# Patient Record
Sex: Female | Born: 1950 | Race: Black or African American | Hispanic: No | Smoking: Never smoker
Health system: Southern US, Community
[De-identification: ages and names within clinical notes are randomized; demographics above are authoritative.]

## PROBLEM LIST (undated history)

## (undated) DIAGNOSIS — D219 Benign neoplasm of connective and other soft tissue, unspecified: Secondary | ICD-10-CM

## (undated) DIAGNOSIS — G43909 Migraine, unspecified, not intractable, without status migrainosus: Secondary | ICD-10-CM

## (undated) DIAGNOSIS — D649 Anemia, unspecified: Secondary | ICD-10-CM

## (undated) DIAGNOSIS — Z5189 Encounter for other specified aftercare: Secondary | ICD-10-CM

## (undated) DIAGNOSIS — H269 Unspecified cataract: Secondary | ICD-10-CM

## (undated) DIAGNOSIS — I10 Essential (primary) hypertension: Secondary | ICD-10-CM

---
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
## 1984-05-02 DIAGNOSIS — G43909 Migraine, unspecified, not intractable, without status migrainosus: Secondary | ICD-10-CM

## 1984-05-02 HISTORY — DX: Migraine, unspecified, not intractable, without status migrainosus: G43.909

## 1985-05-02 DIAGNOSIS — D219 Benign neoplasm of connective and other soft tissue, unspecified: Secondary | ICD-10-CM

## 1985-05-02 HISTORY — DX: Benign neoplasm of connective and other soft tissue, unspecified: D21.9

## 1998-03-24 ENCOUNTER — Observation Stay (HOSPITAL_COMMUNITY): Admission: AD | Admit: 1998-03-24 | Discharge: 1998-03-25 | Payer: Self-pay | Admitting: Family Medicine

## 1999-04-01 ENCOUNTER — Emergency Department (HOSPITAL_COMMUNITY): Admission: EM | Admit: 1999-04-01 | Discharge: 1999-04-01 | Payer: Self-pay | Admitting: Emergency Medicine

## 2000-05-20 ENCOUNTER — Emergency Department (HOSPITAL_COMMUNITY): Admission: EM | Admit: 2000-05-20 | Discharge: 2000-05-20 | Payer: Self-pay | Admitting: Emergency Medicine

## 2000-05-30 ENCOUNTER — Ambulatory Visit (HOSPITAL_COMMUNITY): Admission: RE | Admit: 2000-05-30 | Discharge: 2000-05-30 | Payer: Self-pay

## 2000-05-30 ENCOUNTER — Encounter: Payer: Self-pay | Admitting: Family Medicine

## 2000-06-15 ENCOUNTER — Encounter: Payer: Self-pay | Admitting: Family Medicine

## 2000-06-15 ENCOUNTER — Ambulatory Visit (HOSPITAL_COMMUNITY): Admission: RE | Admit: 2000-06-15 | Discharge: 2000-06-15 | Payer: Self-pay | Admitting: Family Medicine

## 2000-06-26 ENCOUNTER — Emergency Department (HOSPITAL_COMMUNITY): Admission: EM | Admit: 2000-06-26 | Discharge: 2000-06-27 | Payer: Self-pay | Admitting: Emergency Medicine

## 2000-06-27 ENCOUNTER — Encounter: Payer: Self-pay | Admitting: Emergency Medicine

## 2000-08-04 ENCOUNTER — Other Ambulatory Visit: Admission: RE | Admit: 2000-08-04 | Discharge: 2000-08-04 | Payer: Self-pay | Admitting: *Deleted

## 2000-08-04 ENCOUNTER — Encounter: Admission: RE | Admit: 2000-08-04 | Discharge: 2000-08-04 | Payer: Self-pay | Admitting: Obstetrics & Gynecology

## 2001-03-02 ENCOUNTER — Encounter: Admission: RE | Admit: 2001-03-02 | Discharge: 2001-03-02 | Payer: Self-pay | Admitting: Obstetrics & Gynecology

## 2001-12-28 ENCOUNTER — Emergency Department (HOSPITAL_COMMUNITY): Admission: EM | Admit: 2001-12-28 | Discharge: 2001-12-28 | Payer: Self-pay | Admitting: Emergency Medicine

## 2002-01-02 ENCOUNTER — Encounter: Admission: RE | Admit: 2002-01-02 | Discharge: 2002-01-02 | Payer: Self-pay | Admitting: Internal Medicine

## 2002-01-17 ENCOUNTER — Encounter: Admission: RE | Admit: 2002-01-17 | Discharge: 2002-01-17 | Payer: Self-pay | Admitting: Obstetrics and Gynecology

## 2002-01-20 ENCOUNTER — Emergency Department (HOSPITAL_COMMUNITY): Admission: EM | Admit: 2002-01-20 | Discharge: 2002-01-20 | Payer: Self-pay | Admitting: Emergency Medicine

## 2002-01-24 ENCOUNTER — Encounter: Payer: Self-pay | Admitting: Neurology

## 2002-01-24 ENCOUNTER — Ambulatory Visit (HOSPITAL_COMMUNITY): Admission: RE | Admit: 2002-01-24 | Discharge: 2002-01-24 | Payer: Self-pay | Admitting: Neurology

## 2002-02-07 ENCOUNTER — Encounter: Admission: RE | Admit: 2002-02-07 | Discharge: 2002-02-07 | Payer: Self-pay | Admitting: Obstetrics and Gynecology

## 2002-02-22 ENCOUNTER — Encounter: Admission: RE | Admit: 2002-02-22 | Discharge: 2002-02-22 | Payer: Self-pay | Admitting: *Deleted

## 2002-02-22 ENCOUNTER — Other Ambulatory Visit: Admission: RE | Admit: 2002-02-22 | Discharge: 2002-02-22 | Payer: Self-pay | Admitting: *Deleted

## 2002-03-15 ENCOUNTER — Encounter: Admission: RE | Admit: 2002-03-15 | Discharge: 2002-03-15 | Payer: Self-pay | Admitting: *Deleted

## 2002-08-28 ENCOUNTER — Emergency Department (HOSPITAL_COMMUNITY): Admission: EM | Admit: 2002-08-28 | Discharge: 2002-08-28 | Payer: Self-pay | Admitting: Emergency Medicine

## 2002-09-17 ENCOUNTER — Encounter: Admission: RE | Admit: 2002-09-17 | Discharge: 2002-09-17 | Payer: Self-pay | Admitting: Obstetrics and Gynecology

## 2002-11-04 ENCOUNTER — Emergency Department (HOSPITAL_COMMUNITY): Admission: EM | Admit: 2002-11-04 | Discharge: 2002-11-04 | Payer: Self-pay | Admitting: *Deleted

## 2003-09-02 ENCOUNTER — Encounter: Admission: RE | Admit: 2003-09-02 | Discharge: 2003-09-02 | Payer: Self-pay | Admitting: Obstetrics and Gynecology

## 2003-09-02 ENCOUNTER — Other Ambulatory Visit: Admission: RE | Admit: 2003-09-02 | Discharge: 2003-09-02 | Payer: Self-pay | Admitting: Obstetrics and Gynecology

## 2003-09-15 ENCOUNTER — Encounter: Admission: RE | Admit: 2003-09-15 | Discharge: 2003-09-15 | Payer: Self-pay | Admitting: Internal Medicine

## 2003-09-16 ENCOUNTER — Ambulatory Visit (HOSPITAL_COMMUNITY): Admission: RE | Admit: 2003-09-16 | Discharge: 2003-09-16 | Payer: Self-pay | Admitting: Obstetrics and Gynecology

## 2004-05-02 DIAGNOSIS — H269 Unspecified cataract: Secondary | ICD-10-CM

## 2004-05-02 HISTORY — DX: Unspecified cataract: H26.9

## 2004-07-22 ENCOUNTER — Emergency Department (HOSPITAL_COMMUNITY): Admission: EM | Admit: 2004-07-22 | Discharge: 2004-07-22 | Payer: Self-pay | Admitting: Family Medicine

## 2004-08-13 ENCOUNTER — Emergency Department (HOSPITAL_COMMUNITY): Admission: EM | Admit: 2004-08-13 | Discharge: 2004-08-13 | Payer: Self-pay | Admitting: Family Medicine

## 2004-09-30 ENCOUNTER — Encounter: Admission: RE | Admit: 2004-09-30 | Discharge: 2004-12-10 | Payer: Self-pay | Admitting: Family Medicine

## 2005-05-12 ENCOUNTER — Inpatient Hospital Stay (HOSPITAL_COMMUNITY): Admission: AD | Admit: 2005-05-12 | Discharge: 2005-05-12 | Payer: Self-pay | Admitting: Obstetrics & Gynecology

## 2005-06-16 ENCOUNTER — Ambulatory Visit: Payer: Self-pay | Admitting: Family Medicine

## 2005-06-16 ENCOUNTER — Encounter: Payer: Self-pay | Admitting: Family Medicine

## 2005-06-28 ENCOUNTER — Ambulatory Visit (HOSPITAL_COMMUNITY): Admission: RE | Admit: 2005-06-28 | Discharge: 2005-06-28 | Payer: Self-pay | Admitting: Obstetrics & Gynecology

## 2005-12-14 ENCOUNTER — Ambulatory Visit: Payer: Self-pay | Admitting: Obstetrics and Gynecology

## 2005-12-15 ENCOUNTER — Ambulatory Visit (HOSPITAL_COMMUNITY): Admission: RE | Admit: 2005-12-15 | Discharge: 2005-12-15 | Payer: Self-pay | Admitting: Family Medicine

## 2005-12-28 ENCOUNTER — Ambulatory Visit: Payer: Self-pay | Admitting: Obstetrics and Gynecology

## 2006-01-21 ENCOUNTER — Emergency Department (HOSPITAL_COMMUNITY): Admission: EM | Admit: 2006-01-21 | Discharge: 2006-01-21 | Payer: Self-pay | Admitting: Family Medicine

## 2007-05-10 ENCOUNTER — Emergency Department (HOSPITAL_COMMUNITY): Admission: EM | Admit: 2007-05-10 | Discharge: 2007-05-10 | Payer: Self-pay | Admitting: Emergency Medicine

## 2007-05-10 DIAGNOSIS — M542 Cervicalgia: Secondary | ICD-10-CM | POA: Insufficient documentation

## 2007-06-05 ENCOUNTER — Encounter
Admission: RE | Admit: 2007-06-05 | Discharge: 2007-07-12 | Payer: Self-pay | Admitting: Physical Medicine & Rehabilitation

## 2007-07-18 ENCOUNTER — Ambulatory Visit (HOSPITAL_COMMUNITY): Admission: RE | Admit: 2007-07-18 | Discharge: 2007-07-18 | Payer: Self-pay | Admitting: Specialist

## 2007-09-13 ENCOUNTER — Encounter (INDEPENDENT_AMBULATORY_CARE_PROVIDER_SITE_OTHER): Payer: Self-pay | Admitting: Gynecology

## 2007-09-13 ENCOUNTER — Ambulatory Visit: Payer: Self-pay | Admitting: Family Medicine

## 2007-10-17 ENCOUNTER — Encounter (INDEPENDENT_AMBULATORY_CARE_PROVIDER_SITE_OTHER): Payer: Self-pay | Admitting: Nurse Practitioner

## 2007-10-17 ENCOUNTER — Ambulatory Visit: Payer: Self-pay | Admitting: Nurse Practitioner

## 2007-10-17 ENCOUNTER — Encounter: Payer: Self-pay | Admitting: Nurse Practitioner

## 2007-10-17 DIAGNOSIS — I10 Essential (primary) hypertension: Secondary | ICD-10-CM | POA: Insufficient documentation

## 2007-10-17 DIAGNOSIS — D649 Anemia, unspecified: Secondary | ICD-10-CM | POA: Insufficient documentation

## 2007-10-17 DIAGNOSIS — E079 Disorder of thyroid, unspecified: Secondary | ICD-10-CM | POA: Insufficient documentation

## 2007-10-18 ENCOUNTER — Encounter (INDEPENDENT_AMBULATORY_CARE_PROVIDER_SITE_OTHER): Payer: Self-pay | Admitting: Nurse Practitioner

## 2007-10-18 DIAGNOSIS — E7800 Pure hypercholesterolemia, unspecified: Secondary | ICD-10-CM | POA: Insufficient documentation

## 2007-10-18 LAB — CONVERTED CEMR LAB
BUN: 12 mg/dL (ref 6–23)
CO2: 27 meq/L (ref 19–32)
Cholesterol: 225 mg/dL — ABNORMAL HIGH (ref 0–200)
Creatinine, Ser: 0.52 mg/dL (ref 0.40–1.20)
Glucose, Bld: 90 mg/dL (ref 70–99)
HCT: 42.7 % (ref 36.0–46.0)
MCV: 83.9 fL (ref 78.0–100.0)
RBC: 5.09 M/uL (ref 3.87–5.11)
Total Bilirubin: 0.9 mg/dL (ref 0.3–1.2)
Total CHOL/HDL Ratio: 2.4
Total Protein: 7.7 g/dL (ref 6.0–8.3)
Triglycerides: 75 mg/dL (ref <150)
VLDL: 15 mg/dL (ref 0–40)
WBC: 5 10*3/uL (ref 4.0–10.5)

## 2007-10-22 ENCOUNTER — Ambulatory Visit (HOSPITAL_COMMUNITY): Admission: RE | Admit: 2007-10-22 | Discharge: 2007-10-22 | Payer: Self-pay | Admitting: Family Medicine

## 2007-11-26 ENCOUNTER — Ambulatory Visit: Payer: Self-pay | Admitting: Nurse Practitioner

## 2007-11-26 DIAGNOSIS — N959 Unspecified menopausal and perimenopausal disorder: Secondary | ICD-10-CM | POA: Insufficient documentation

## 2007-11-29 ENCOUNTER — Encounter (HOSPITAL_COMMUNITY): Admission: RE | Admit: 2007-11-29 | Discharge: 2008-01-24 | Payer: Self-pay | Admitting: Family Medicine

## 2008-01-04 ENCOUNTER — Telehealth (INDEPENDENT_AMBULATORY_CARE_PROVIDER_SITE_OTHER): Payer: Self-pay | Admitting: Nurse Practitioner

## 2008-01-15 ENCOUNTER — Encounter (INDEPENDENT_AMBULATORY_CARE_PROVIDER_SITE_OTHER): Payer: Self-pay | Admitting: *Deleted

## 2008-02-20 ENCOUNTER — Ambulatory Visit: Payer: Self-pay | Admitting: Nurse Practitioner

## 2008-02-20 DIAGNOSIS — R634 Abnormal weight loss: Secondary | ICD-10-CM | POA: Insufficient documentation

## 2008-02-20 DIAGNOSIS — F341 Dysthymic disorder: Secondary | ICD-10-CM | POA: Insufficient documentation

## 2009-05-02 DIAGNOSIS — I10 Essential (primary) hypertension: Secondary | ICD-10-CM

## 2009-05-02 HISTORY — DX: Essential (primary) hypertension: I10

## 2010-09-17 NOTE — Group Therapy Note (Signed)
NAMEJANAI, Mercer               ACCOUNT NO.:  [id number]   MEDICAL RECORD NO.:  [id number]          PATIENT TYPE:  WOC   LOCATION:  [facility]                   FACILITY:  [facility]   PHYSICIAN:  Kathlyn Sacramento, M.D.   DATE OF BIRTH:  [birth year]   DATE OF SERVICE:                                    CLINIC NOTE   CHIEF COMPLAINT:  Irregular menses.   HISTORY OF PRESENT ILLNESS:  The patient is a 60 year old African-American  female, who states that she was recently seen in the MAU feeling like she  was anemic and was started on hemocyte.  The patient states she has a  history of fibroids and usually has periods that lasts 7-9 days.  She states  that her periods are regular at this time.  The patient has been encouraged  to have a hysterectomy multiple times, but has refused the surgery.  The  patient has also had several endometrial biopsies, but today, refuses to  have another one.   PHYSICAL EXAMINATION:  VITAL SIGNS: Temp 97.3, pulse 85, blood pressure  162/96, weight 127.1 and height 5 feet 3 inches.  GENERAL:  A well-developed, well-nourished female in no acute distress.  CARDIOVASCULAR:  Normal S1 and S2 with no murmurs, gallops or rubs.  CHEST:  Clear to auscultation bilaterally.  ABDOMEN:  Positive bowel sounds, palpable uterus 2-3 cm above the umbilicus  and tilted to the right.  GENITOURINARY EXAM:  Normal external genitalia.  The cervix normal.  The  uterus 2-3 cm above the umbilicus and tilted with extension to the right.  The adnexa not palpable.  BREASTS:  No skin changes, nontender, no lymphadenopathy and no breast mass.   IMPRESSION:  A 60 year old perimenopausal female with fibroids.   PLAN:  1.  Hysterectomy recommended.  2.  CBC with __________  done today.  3.  FSH to determine if the patient is menopausal.  4.  Endometrial biopsy suggested and the patient refused.  5.  Mammogram scheduled.           ______________________________  Kathlyn Sacramento,  M.D.     AC/MEDQ  D:  06/16/2005  T:  06/17/2005  Job:  [id number]

## 2010-09-17 NOTE — Group Therapy Note (Signed)
Mindy Mercer, MACIOLEK               ACCOUNT NO.:  [id number]   MEDICAL RECORD NO.:  [id number]          PATIENT TYPE:  WOC   LOCATION[facility]                   [facility]   PHYSICIAN:  Argentina Donovan, MD        DATE OF BIRTH:  1951/01/12   DATE OF SERVICE:  12/14/2005                                    CLINIC NOTE   The patient is a 60 year old African American female who was in six months  ago with some bleeding and huge fibroid tumors measuring 18 cm vertical  height and 18 in transverse diameter.  There is calcification.  She had been  sent by the maternity admissions because of bleeding.  She refused to  undergo biopsy at that time and hysterectomy was recommended but she did not  want to do that because of fear.  Her hemoglobin at that time was 11.3. She  has been continually on iron and came in today because she is having  spotting and some back ache.  I am going to do an ultrasound so we can see  the kidneys and see if we can see the endometrial stripe and try and  convince the patient to have this taken out.  I attempted endometrial biopsy  but I could not get beyond the lower segment of fibroid that seemed just 2  cm beyond the cervical os.  The patient will be back in two weeks to discuss  our findings.           ______________________________  Argentina Donovan, MD     PR/MEDQ  D:  12/14/2005  T:  12/14/2005  Job:  [id number]

## 2010-09-17 NOTE — Group Therapy Note (Signed)
NAMETEMITOPE, FLAMMER               ACCOUNT NO.:  [id number]   MEDICAL RECORD NO.:  [id number]          PATIENT TYPE:  WOC   [facility]ATION[facility]                   [facility]   PHYSICIAN:  Argentina Donovan, MD        DATE OF BIRTH:  [birth year]   DATE OF SERVICE:  12/28/2005                                    CLINIC NOTE   The patient is a 60 year old gravida 4, para 4-0-0-4 African-American female  who came in spotting some time ago.  We tried last time she was in 2 weeks  ago to do an endometrial biopsy but with large 18-cm-plus fibroids we were  unable to get into the uterus.  Ultrasound showed some fluid in the uterus  which was not normal in this postmenopausal patient.  She has been resistant  to talk about hysterectomy.  We have recommended it on several occasions.  We have talked to her today.  It is not a question of fear, she just says  she has faith that she is not going to have anything serious, and in spite  of my pressure she is resistant to go through surgery.  I am going to have  her come back in 6 months just to once again encourage her to get the  situation taken care of  and to talk, hoping she will talk to her children  about it.   DIAGNOSIS:  Very large leiomyomata uteri, patient resistant to surgery.           ______________________________  Argentina Donovan, MD     PR/MEDQ  D:  12/28/2005  T:  12/29/2005  Job:  [id number]

## 2011-10-01 ENCOUNTER — Encounter (HOSPITAL_COMMUNITY): Payer: Self-pay | Admitting: Emergency Medicine

## 2011-10-01 ENCOUNTER — Emergency Department (HOSPITAL_COMMUNITY)
Admission: EM | Admit: 2011-10-01 | Discharge: 2011-10-01 | Disposition: A | Discharge status: Home / Self Care | Payer: Medicaid Other | Attending: Emergency Medicine | Admitting: Emergency Medicine

## 2011-10-01 DIAGNOSIS — R51 Headache: Secondary | ICD-10-CM

## 2011-10-01 DIAGNOSIS — R42 Dizziness and giddiness: Secondary | ICD-10-CM | POA: Insufficient documentation

## 2011-10-01 DIAGNOSIS — I10 Essential (primary) hypertension: Secondary | ICD-10-CM | POA: Insufficient documentation

## 2011-10-01 HISTORY — DX: Anemia, unspecified: D64.9

## 2011-10-01 HISTORY — DX: Benign neoplasm of connective and other soft tissue, unspecified: D21.9

## 2011-10-01 HISTORY — DX: Essential (primary) hypertension: I10

## 2011-10-01 HISTORY — DX: Unspecified cataract: H26.9

## 2011-10-01 HISTORY — DX: Encounter for other specified aftercare: Z51.89

## 2011-10-01 HISTORY — DX: Migraine, unspecified, not intractable, without status migrainosus: G43.909

## 2011-10-01 LAB — CBC
HCT: 37.8 % (ref 36.0–46.0)
Hemoglobin: 12.2 g/dL (ref 12.0–15.0)
MCH: 25.8 pg — ABNORMAL LOW (ref 26.0–34.0)
MCHC: 32.3 g/dL (ref 30.0–36.0)
MCV: 80.1 fL (ref 78.0–100.0)
Platelets: 151 10*3/uL (ref 150–400)
RBC: 4.72 MIL/uL (ref 3.87–5.11)
RDW: 14.4 % (ref 11.5–15.5)
WBC: 5.2 10*3/uL (ref 4.0–10.5)

## 2011-10-01 LAB — TROPONIN I: Troponin I: <0.3 ng/mL (ref <0.30)

## 2011-10-01 LAB — URINALYSIS, ROUTINE W REFLEX MICROSCOPIC
Bilirubin Urine: NEGATIVE
Glucose, UA: NEGATIVE mg/dL
Hgb urine dipstick: NEGATIVE
Ketones, ur: NEGATIVE mg/dL
Nitrite: NEGATIVE
Protein, ur: NEGATIVE mg/dL
Specific Gravity, Urine: 1.011 (ref 1.005–1.030)
Urobilinogen, UA: 0.2 mg/dL (ref 0.0–1.0)
pH: 7 (ref 5.0–8.0)

## 2011-10-01 LAB — BASIC METABOLIC PANEL
BUN: 9 mg/dL (ref 6–23)
CO2: 27 mEq/L (ref 19–32)
Calcium: 9.2 mg/dL (ref 8.4–10.5)
Chloride: 105 mEq/L (ref 96–112)
Creatinine, Ser: 0.57 mg/dL (ref 0.50–1.10)
GFR calc Af Amer: >90 mL/min (ref >90)
GFR calc non Af Amer: >90 mL/min (ref >90)
Glucose, Bld: 71 mg/dL (ref 70–99)
Potassium: 3.7 mEq/L (ref 3.5–5.1)
Sodium: 140 mEq/L (ref 135–145)

## 2011-10-01 LAB — URINE MICROSCOPIC-ADD ON

## 2011-10-01 MED ORDER — SODIUM CHLORIDE 0.9 % IV BOLUS (SEPSIS)
1000.0000 mL | Freq: Once | INTRAVENOUS | Status: AC
  Administered 2011-10-01: 1000 mL via INTRAVENOUS

## 2011-10-01 MED ORDER — DIAZEPAM 5 MG/ML IJ SOLN
5.0000 mg | Freq: Once | INTRAMUSCULAR | Status: AC
  Administered 2011-10-01: 5 mg via INTRAVENOUS
  Filled 2011-10-01: qty 2

## 2011-10-01 MED ORDER — HYDROCHLOROTHIAZIDE 25 MG PO TABS: 25.0000 mg | ORAL_TABLET | Freq: Every day | ORAL | Status: DC

## 2011-10-01 NOTE — ED Provider Notes (Signed)
History    61 year old female with hypertension. Patient states that she was diagnosed with this about 3 years ago. She was previously on medication, but cannot remember what specifically. Has not taken anything in quite a while. Recently has been having intermittent headaches and the sensation of feeling dizzy. She calls these "mini strokes," but does not describe any focal deficit associated with them. She thinks that this may be related to her hypertension. She denies the symptoms currently though. No chest pain or shortness of breath. No unusual swelling. No urinary complaints. CSN: [id number]  Arrival date & time 10/01/11  1205   First MD Initiated Contact with Patient 10/01/11 1329      Chief Complaint  Patient presents with  . Hypertension  . Dizziness    (Consider location/radiation/quality/duration/timing/severity/associated sxs/prior treatment) HPI  Past Medical History  Diagnosis Date  . Cataracts, bilateral   . Fibroid tumor   . Anemia   . Hypertension   . Migraines   . Blood transfusion     History reviewed. No pertinent past surgical history.  History reviewed. No pertinent family history.  History  Substance Use Topics  . Smoking status: Never Smoker   . Smokeless tobacco: Not on file  . Alcohol Use: No    OB History    Grav Para Term Preterm Abortions TAB SAB Ect Mult Living                  Review of Systems   Review of symptoms negative unless otherwise noted in HPI.   Allergies  Review of patient's allergies indicates no known allergies.  Home Medications   Current Outpatient Rx  Name Route Sig Dispense Refill  . ASPIRIN EC 81 MG PO TBEC Oral Take 81 mg by mouth daily as needed. For pain.    Marland Kitchen HYDROCHLOROTHIAZIDE 25 MG PO TABS Oral Take 1 tablet (25 mg total) by mouth daily. 30 tablet 1    BP 181/105  Pulse 102  Temp(Src) 98.3 F (36.8 C) (Oral)  Resp 20  SpO2 100%  Physical Exam  Nursing note and vitals reviewed. Constitutional:  She is oriented to person, place, and time. She appears well-developed and well-nourished. No distress.  HENT:  Head: Normocephalic and atraumatic.  Right Ear: External ear normal.  Left Ear: External ear normal.  Mouth/Throat: Oropharynx is clear and moist.  Eyes: Conjunctivae and EOM are normal. Pupils are equal, round, and reactive to light. Right eye exhibits no discharge. Left eye exhibits no discharge.  Neck: Neck supple.  Cardiovascular: Normal rate, regular rhythm and normal heart sounds.  Exam reveals no gallop and no friction rub.   No murmur heard. Pulmonary/Chest: Effort normal and breath sounds normal. No respiratory distress.  Abdominal: Soft. She exhibits no distension. There is no tenderness.  Musculoskeletal: She exhibits no edema and no tenderness.  Neurological: She is alert and oriented to person, place, and time. No cranial nerve deficit. She exhibits normal muscle tone. Coordination normal.       Speech is clear and content appropriate. Good finger to nose testing bilaterally. Gait is steady.  Skin: Skin is warm and dry. She is not diaphoretic.  Psychiatric: She has a normal mood and affect. Her behavior is normal. Thought content normal.    ED Course  Procedures (including critical care time)  Labs Reviewed  CBC - Abnormal; Notable for the following:    MCH 25.8 (*)    All other components within normal limits  URINALYSIS, ROUTINE W REFLEX  MICROSCOPIC - Abnormal; Notable for the following:    Leukocytes, UA TRACE (*)    All other components within normal limits  TROPONIN I  BASIC METABOLIC PANEL  URINE MICROSCOPIC-ADD ON   No results found.  EKG:  Rhythm: Normal Rate: 99 Axis: Left axis deviation Intervals: First degree AV block. Left ventricular hypertrophy. ST segments: Nonspecific ST changes   1. Hypertension   2. Headache       MDM  61 year old female with hypertension. Workup negative for any signs of end organ damage. Will start patient  on hydrochlorothiazide. Discussed with patient that she very well may need additional medication. Discussed that she needs to have a PCP and followup for further management. Return precautions sooner were discussed. Outpatient followup.        Raeford Razor, MD 10/05/11 [phone]

## 2011-10-01 NOTE — Discharge Instructions (Signed)
Arterial Hypertension Arterial hypertension (high blood pressure) is a condition of elevated pressure in your blood vessels. Hypertension over a long period of time is a risk factor for strokes, heart attacks, and heart failure. It is also the leading cause of kidney (renal) failure.  CAUSES   In Adults -- Over 90% of all hypertension has no known cause. This is called essential or primary hypertension. In the other 10% of people with hypertension, the increase in blood pressure is caused by another disorder. This is called secondary hypertension. Important causes of secondary hypertension are:   Heavy alcohol use.   Obstructive sleep apnea.   Hyperaldosterosim (Conn's syndrome).   Steroid use.   Chronic kidney failure.   Hyperparathyroidism.   Medications.   Renal artery stenosis.   Pheochromocytoma.   Cushing's disease.   Coarctation of the aorta.   Scleroderma renal crisis.   Licorice (in excessive amounts).   Drugs (cocaine, methamphetamine).  Your caregiver can explain any items above that apply to you.  In Children -- Secondary hypertension is more common and should always be considered.   Pregnancy -- Few women of childbearing age have high blood pressure. However, up to 10% of them develop hypertension of pregnancy. Generally, this will not harm the woman. It may be a sign of 3 complications of pregnancy: preeclampsia, HELLP syndrome, and eclampsia. Follow up and control with medication is necessary.  SYMPTOMS   This condition normally does not produce any noticeable symptoms. It is usually found during a routine exam.   Malignant hypertension is a late problem of high blood pressure. It may have the following symptoms:   Headaches.   Blurred vision.   End-organ damage (this means your kidneys, heart, lungs, and other organs are being damaged).   Stressful situations can increase the blood pressure. If a person with normal blood pressure has their blood  pressure go up while being seen by their caregiver, this is often termed "white coat hypertension." Its importance is not known. It may be related with eventually developing hypertension or complications of hypertension.   Hypertension is often confused with mental tension, stress, and anxiety.  DIAGNOSIS  The diagnosis is made by 3 separate blood pressure measurements. They are taken at least 1 week apart from each other. If there is organ damage from hypertension, the diagnosis may be made without repeat measurements. Hypertension is usually identified by having blood pressure readings:  Above 140/90 mmHg measured in both arms, at 3 separate times, over a couple weeks.   Over 130/80 mmHg should be considered a risk factor and may require treatment in patients with diabetes.  Blood pressure readings over 120/80 mmHg are called "pre-hypertension" even in non-diabetic patients. To get a true blood pressure measurement, use the following guidelines. Be aware of the factors that can alter blood pressure readings.  Take measurements at least 1 hour after caffeine.   Take measurements 30 minutes after smoking and without any stress. This is another reason to quit smoking - it raises your blood pressure.   Use a proper cuff size. Ask your caregiver if you are not sure about your cuff size.   Most home blood pressure cuffs are automatic. They will measure systolic and diastolic pressures. The systolic pressure is the pressure reading at the start of sounds. Diastolic pressure is the pressure at which the sounds disappear. If you are elderly, measure pressures in multiple postures. Try sitting, lying or standing.   Sit at rest for a minimum of   5 minutes before taking measurements.   You should not be on any medications like decongestants. These are found in many cold medications.   Record your blood pressure readings and review them with your caregiver.  If you have hypertension:  Your caregiver  may do tests to be sure you do not have secondary hypertension (see "causes" above).   Your caregiver may also look for signs of metabolic syndrome. This is also called Syndrome X or Insulin Resistance Syndrome. You may have this syndrome if you have type 2 diabetes, abdominal obesity, and abnormal blood lipids in addition to hypertension.   Your caregiver will take your medical and family history and perform a physical exam.   Diagnostic tests may include blood tests (for glucose, cholesterol, potassium, and kidney function), a urinalysis, or an EKG. Other tests may also be necessary depending on your condition.  PREVENTION  There are important lifestyle issues that you can adopt to reduce your chance of developing hypertension:  Maintain a normal weight.   Limit the amount of salt (sodium) in your diet.   Exercise often.   Limit alcohol intake.   Get enough potassium in your diet. Discuss specific advice with your caregiver.   Follow a DASH diet (dietary approaches to stop hypertension). This diet is rich in fruits, vegetables, and low-fat dairy products, and avoids certain fats.  PROGNOSIS  Essential hypertension cannot be cured. Lifestyle changes and medical treatment can lower blood pressure and reduce complications. The prognosis of secondary hypertension depends on the underlying cause. Many people whose hypertension is controlled with medicine or lifestyle changes can live a normal, healthy life.  RISKS AND COMPLICATIONS  While high blood pressure alone is not an illness, it often requires treatment due to its short- and long-term effects on many organs. Hypertension increases your risk for:  CVAs or strokes (cerebrovascular accident).   Heart failure due to chronically high blood pressure (hypertensive cardiomyopathy).   Heart attack (myocardial infarction).   Damage to the retina (hypertensive retinopathy).   Kidney failure (hypertensive nephropathy).  Your caregiver can  explain list items above that apply to you. Treatment of hypertension can significantly reduce the risk of complications. TREATMENT   For overweight patients, weight loss and regular exercise are recommended. Physical fitness lowers blood pressure.   Mild hypertension is usually treated with diet and exercise. A diet rich in fruits and vegetables, fat-free dairy products, and foods low in fat and salt (sodium) can help lower blood pressure. Decreasing salt intake decreases blood pressure in a 1/3 of people.   Stop smoking if you are a smoker.  The steps above are highly effective in reducing blood pressure. While these actions are easy to suggest, they are difficult to achieve. Most patients with moderate or severe hypertension end up requiring medications to bring their blood pressure down to a normal level. There are several classes of medications for treatment. Blood pressure pills (antihypertensives) will lower blood pressure by their different actions. Lowering the blood pressure by 10 mmHg may decrease the risk of complications by as much as 25%. The goal of treatment is effective blood pressure control. This will reduce your risk for complications. Your caregiver will help you determine the best treatment for you according to your lifestyle. What is excellent treatment for one person, may not be for you. HOME CARE INSTRUCTIONS   Do not smoke.   Follow the lifestyle changes outlined in the "Prevention" section.   If you are on medications, follow the directions   carefully. Blood pressure medications must be taken as prescribed. Skipping doses reduces their benefit. It also puts you at risk for problems.   Follow up with your caregiver, as directed.   If you are asked to monitor your blood pressure at home, follow the guidelines in the "Diagnosis" section above.  SEEK MEDICAL CARE IF:   You think you are having medication side effects.   You have recurrent headaches or lightheadedness.     You have swelling in your ankles.   You have trouble with your vision.  SEEK IMMEDIATE MEDICAL CARE IF:   You have sudden onset of chest pain or pressure, difficulty breathing, or other symptoms of a heart attack.   You have a severe headache.   You have symptoms of a stroke (such as sudden weakness, difficulty speaking, difficulty walking).  MAKE SURE YOU:   Understand these instructions.   Will watch your condition.   Will get help right away if you are not doing well or get worse.  Document Released: 04/18/2005 Document Revised: 04/07/2011 Document Reviewed: 11/16/2006 ExitCare Patient Information 2012 [employer].RESOURCE GUIDE  Chronic Pain Problems: Contact [facility]  [phone] Patients need to be referred by their primary care doctor.  Insufficient Money for Medicine: Contact United Way:  call "211" or [facility] [phone].  No Primary Care Doctor: - Call [facility]  [phone] - can help you locate a primary care doctor that  accepts your insurance, provides certain services, etc. - Physician Referral Service- 1-800-533-3463  Agencies that provide inexpensive medical care: - [facility]  [phone] - [facility]  [phone] - [facility]  [phone] - [facility]  [phone] - [facility]  [phone] - [facility]  [phone]  Medicaid-accepting [place] Providers: - [facility]- [address]  [phone], Mon-Fri 9am-7pm, Sat 9am-1pm - [facility]- [address], [address]  [phone] - [facility]- [address], [address]  [phone] - [facility]- [address] [place] [address]  [phone] - Ve[facility]- [address], [address], [phone]  Only accepts [place] Access Medicaid patients after they have their name  applied to their card  Self Pay (no insurance) in  [place]: - Sickle Cell Patients: Dr Eric Dean, [facility]  [address], [phone] - [facility]- [address]  [phone]       -     [facility]- [address]       -     [facility]- see information above (Speak to Pam H if you do not have insurance)       -  [facility]- [address], [phone]       -  [facility] [place]- [address],  [phone]       -  [facility]- [address] [place] [address], [phone]       -  Dr Osei-Bonsu-  [address], [place], [phone]       -  [facility]- [address], [phone]       -  [facility]- [address] [place] [address], [phone], also [address], [phone]       -    [facility]- [address], [phone], 1st & 3rd Saturday   every month, 10am-1pm  1) Find a Doctor and Pay Out of Pocket Although you won't have to find out who is covered by your insurance plan, it is a good idea to ask around and get recommendations. You will then need to call the office and see if the doctor you have chosen will accept you as a new patient and what types of options they offer for patients who are self-pay. Some doctors offer discounts or will set up payment plans for their patients who do not have insurance, but you will need to ask so you aren't surprised when you get to your appointment.  2) Contact Your Local Health Department Not all health departments have doctors that can see patients for sick visits, but many do, so it is worth a call to see if yours does. If you don't know where your local health department is, you can check in your phone book. The CDC also has a tool to help you locate your state's health department, and many state websites also have listings of all of their local health departments.  3) Find a Walk-in Clinic If your illness is not likely to be very severe or  complicated, you may want to try a walk in clinic. These are popping up all over the country in pharmacies, drugstores, and shopping centers. They're usually staffed by nurse practitioners or physician assistants that have been trained to treat common illnesses and complaints. They're usually fairly quick and inexpensive. However, if you have serious medical issues or chronic medical problems, these are probably not your best option  STD Testing - [place] [facility] [place], [facility], [address], [place], phone [phone] or 1-877-539-9860.  Monday - Friday, call for an appointment. - [place] [facility] [place], [facility], [address], [place], phone [phone] or 1-877-539-9860.  Monday - Friday, call for an appointment.  Abuse/Neglect: - [place] Child Abuse Hotline (336) 641-3795 - [place] Child Abuse Hotline 800-378-5315 (After Hours)  Emergency Shelter:  [facility] (336) 271-5985  Maternity Homes: - [facility] (336) 275-9566 - [facility] (704) 372-4663  MRSA Hotline #:   832-7006  [place] Resources  [facility] [place]  [employer] [place] [facility] [address]                 [address]         [address]                                                 [place]                              [place] Phone:  [phone]                                  Phone:  [phone]                   Phone:  [phone]  [place] [facility], [phone] - [place] Services - [facility]- 1-888-581-9988       -       [facility] in [place], [address],                                  336-349-4454, Insurance  [place] Child Abuse Hotline (336) 342-1394 or (336) 342-3537 (After Hours)   Behavioral Health Services  Substance  Abuse Resources: - [facility]  336-882-2125 - [facility] 336-784-9470 - [facility] 336-285-9073 - [facility] 336-845-3988 - Residential & Outpatient Substance Abuse Program  800-659-3381  Psychological Services: - [facility]  [phone] - [facility]  [phone] - [facility], [address], [place], ACCESS LINE: 1-800-853-5163 or 336-641-4981, Http://www.[facility].com/services/adult.htm  Dental Assistance  If unable to pay or uninsured, contact:  [facility] or [place] [facility] to become qualified for the adult dental clinic.  Patients with Medicaid: [facility] [address], [phone], [phone]  If unable to pay, or uninsured, contact [facility] ([phone]) or [place] [facility] ([phone] in [place], [phone] in [place]) to become qualified for the adult dental clinic  Other Low-Cost Community Dental Services: - [facility]- [address], [place], [place], [address], [phone], [address], 2nd and 4th Thursday of the month at 6:30am.  10 clients each day by appointment, can sometimes see walk-in patients if someone does not show for an appointment. - [facility]- [address], [place], [place], [address], [phone] - [facility]- [address], [place], [place], [address], [phone] - [place] [facility]- [phone] - [facility]- [phone] - [facility]- [phone]  

## 2011-10-01 NOTE — ED Notes (Signed)
Pt states that she was dx w/ hypertension 3 years ago.  Has been feeling dizzy, headache,  w/ a dry mouth for the last couple of weeks.  Denies sx now.  States she has headaches which she calls "ministokes". Denies any other neurological/stoke sx.

## 2011-10-18 ENCOUNTER — Encounter: Payer: Self-pay | Admitting: Family Medicine

## 2011-10-18 ENCOUNTER — Ambulatory Visit (INDEPENDENT_AMBULATORY_CARE_PROVIDER_SITE_OTHER): Payer: Medicaid Other | Admitting: Family Medicine

## 2011-10-18 VITALS — BP 152/108 | HR 96 | Temp 97.9°F | Ht 64.0 in | Wt 123.0 lb

## 2011-10-18 DIAGNOSIS — I10 Essential (primary) hypertension: Secondary | ICD-10-CM

## 2011-10-18 DIAGNOSIS — E7800 Pure hypercholesterolemia, unspecified: Secondary | ICD-10-CM

## 2011-10-18 DIAGNOSIS — F341 Dysthymic disorder: Secondary | ICD-10-CM

## 2011-10-18 LAB — LIPID PANEL
Cholesterol: 266 mg/dL — ABNORMAL HIGH (ref 0–200)
HDL: 101 mg/dL (ref >39)
Total CHOL/HDL Ratio: 2.6 Ratio
Triglycerides: 79 mg/dL (ref <150)

## 2011-10-18 LAB — TSH: TSH: 0.454 u[IU]/mL (ref 0.350–4.500)

## 2011-10-18 MED ORDER — SERTRALINE HCL 50 MG PO TABS: 50.0000 mg | ORAL_TABLET | Freq: Every day | ORAL | Status: DC

## 2011-10-18 MED ORDER — LISINOPRIL 10 MG PO TABS: 10.0000 mg | ORAL_TABLET | Freq: Every day | ORAL | Status: DC

## 2011-10-18 NOTE — Assessment & Plan Note (Signed)
A: start Zoloft P: f/u in 2 weeks for PHQ-9 and GAD-7 -counseled patient that it will take atleast 6 weeks to determine if she will get a good response on Zoloft. -advised patient to evaluate like and cut out stressor where possible.

## 2011-10-18 NOTE — Patient Instructions (Addendum)
Mrs. Divelbiss,  Thank you for coming in to see me today.  Please f/u in 2 weeks to review blood work, f/u BP and pap smear.   For BP: -low salt diet, do shake salt on food. Limit fast food to once per week.  -eat plenty of fruits and veggies -exercise, walk 30 minutes daily.   For anxiety and depression:  Start zoloft daily.   Dr. Armen Pickup

## 2011-10-18 NOTE — Assessment & Plan Note (Signed)
Check lipid panel  

## 2011-10-18 NOTE — Assessment & Plan Note (Addendum)
A: improved from ED BP.  P:  -negotiated with patient to switch to ACEi for BP control as she does not like how she feels on HCTZ.  -Check TSH.  -F/u in 2 weeks to check Cr and BP.  -lifestyle modifications: low salt diet, regular exercise.

## 2011-10-18 NOTE — Progress Notes (Signed)
Subjective:     Patient ID: Mindy Mercer, female   DOB: 09/28/1950, 61 y.o.   MRN: [id number]  HPI 61 yo F presents to establish primary care and for ED f/u of hypertension.   1. HTN: since 2011. Not taking medication for 2 years. Was seen in ED on 10/01/11 and started on HCTZ 25 mg. Denies CP, SOB and LE edema. Admits to feeling dizzy and lightheaded despite improved BP. She attributes symptom to HCTZ.   2. Anxiety: admits to anxiety x most of her life. 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 She also admits to concomitant depression but denies history of suicidal or homicidal ideation. She denies current suicidal ideation. She has been treated with an antianxiety medication in the past but cannot remember the name of it.   Past Medical History  Diagnosis Date  . Fibroid tumor 1987  . Migraines 1986  . Anemia 1970  . Blood transfusion 1970  . Cataracts, bilateral 2006  . Hypertension 2011   History reviewed. No pertinent past surgical history.  History   Social History  . Marital Status: Legally Separated    Spouse Name: N/A    Number of Children: 4  . Years of Education: 14   Occupational History  . Not on file.   Social History Main Topics  . Smoking status: Never Smoker   . Smokeless tobacco: Never Used  . Alcohol Use: No  . Drug Use: No  . Sexually Active: Yes    Birth Control/ Protection: None   Social History Narrative   Lives with partner of 12 years and son.    Screening: Last pap smear 3-4 years ago. No history of abnormal pap smears.  Last mammogram 3-4 years ago.  Last colonoscopy   Review of Systems  Constitutional: Positive for diaphoresis and unexpected weight change. Negative for fever, chills, activity change, appetite change and fatigue.       Sweating.  Feeling hot inside.  Weight loss.   HENT: Positive for neck pain, neck stiffness and dental problem. Negative for hearing loss, ear pain, nosebleeds,  congestion, sore throat, facial swelling, rhinorrhea, sneezing, drooling, mouth sores, trouble swallowing, voice change, postnasal drip, sinus pressure, tinnitus and ear discharge.   Eyes: Negative.   Respiratory: Negative.   Cardiovascular: Positive for chest pain. Negative for palpitations and leg swelling.       Musculoskeletal chest pain.   Gastrointestinal: Negative.   Genitourinary: Positive for vaginal bleeding. Negative for dysuria, urgency, frequency, hematuria, flank pain, decreased urine volume, vaginal discharge, enuresis, difficulty urinating, genital sores, vaginal pain, menstrual problem, pelvic pain and dyspareunia.       Noticed smear of blood after intercourse x 1. Last menstrual period age 60.   Musculoskeletal: Positive for myalgias and arthralgias. Negative for back pain, joint swelling and gait problem.       Stiffness in neck.   Skin: Negative.   Neurological: Positive for dizziness, weakness, light-headedness and headaches. Negative for tremors, seizures, syncope, facial asymmetry, speech difficulty and numbness.  Hematological: Negative.   Psychiatric/Behavioral: Negative for suicidal ideas, hallucinations, behavioral problems, confusion, disturbed wake/sleep cycle, self-injury, dysphoric mood, decreased concentration and agitation. The patient is nervous/anxious. The patient is not hyperactive.        Reports stress, depression. Denies suicidal or homicidal ideation.    Objective:   Physical Exam BP 152/108  Pulse 96  Temp 97.9 F (36.6 C) (Oral)  Ht 5\' 4"  (1.626 m)  Wt 123 lb (55.792 kg)  BMI 21.11 kg/m2 Constitutional: She appears well-developed and well-nourished. No distress.  HENT:  Head: Normocephalic and atraumatic.  Eyes: EOM are normal. Pupils are equal, round, and reactive to light.  Cardiovascular: Normal rate, regular rhythm and intact distal pulses.   Pulmonary/Chest: Effort normal and breath sounds normal.  Musculoskeletal: She exhibits no  edema.   Assessment and Plan:  See problem list

## 2011-10-27 ENCOUNTER — Encounter: Payer: Self-pay | Admitting: Family Medicine

## 2011-11-01 ENCOUNTER — Ambulatory Visit (INDEPENDENT_AMBULATORY_CARE_PROVIDER_SITE_OTHER): Payer: Medicaid Other | Admitting: Family Medicine

## 2011-11-01 ENCOUNTER — Encounter: Payer: Self-pay | Admitting: Family Medicine

## 2011-11-01 VITALS — BP 174/105 | HR 90 | Temp 98.5°F | Ht 64.0 in | Wt 124.0 lb

## 2011-11-01 DIAGNOSIS — F341 Dysthymic disorder: Secondary | ICD-10-CM

## 2011-11-01 DIAGNOSIS — E7800 Pure hypercholesterolemia, unspecified: Secondary | ICD-10-CM

## 2011-11-01 DIAGNOSIS — I10 Essential (primary) hypertension: Secondary | ICD-10-CM

## 2011-11-01 LAB — COMPREHENSIVE METABOLIC PANEL
Alkaline Phosphatase: 63 U/L (ref 39–117)
BUN: 9 mg/dL (ref 6–23)
Creat: 0.65 mg/dL (ref 0.50–1.10)
Glucose, Bld: 82 mg/dL (ref 70–99)
Total Bilirubin: 0.9 mg/dL (ref 0.3–1.2)

## 2011-11-01 MED ORDER — LISINOPRIL 20 MG PO TABS: 20.0000 mg | ORAL_TABLET | Freq: Every day | ORAL | Status: DC

## 2011-11-01 NOTE — Patient Instructions (Signed)
Ms. Saxton,  Thank you for coming in to see me today.  For your BP: 1. Increase lisinopril to 20 mg (2 tabs) daily 2. Avoid fast food, fried foods and salty snacks 3. Bake and broil foods and eat plenty of vegetables.   F/u in 2 weeks for BP recheck and pap smear.   Dr. Armen Pickup

## 2011-11-02 ENCOUNTER — Encounter: Payer: Self-pay | Admitting: Family Medicine

## 2011-11-02 NOTE — Assessment & Plan Note (Signed)
A: declined with elevated BP.  Meds: compliant. Diet: non-compliant. High salt. P: -increase lisinopril to 20 mg daily  -discussed diet: high salt diet lead to fluid retention. Discussed cooking at home, bake and broil, no additional salt, cut out salty snacks (like pork rinds).  -close f/u in 2 weeks.  -rechecked Cr given recent addition of ACEi and Cr remains normal.

## 2011-11-02 NOTE — Progress Notes (Signed)
Subjective:     Patient ID: Mindy Mercer, female   DOB: [birth year], 61 y.o.   MRN: [id number]  HPI 61 y.o. presents for f/u to discuss the following:  1. HTN: compliant with lisinopril. Denies cough, swelling, chest pain, shortness of breath or visual changes. Admits to stress related to taking care of her family members.   2. Anxiety: improved with zoloft. Denies suicidal ideation. Admits to decreased sex drive and fatigue.   3. HLD: patient did not receive results note. Discussed that her Tchol and LDL (bad cholesterol) are on the rise. She admits to eating poorl maybe 1 high fat (Fast food) meal per day and multiple fatty/salty snacks. She is interested in boost nutritional supplement.   Review of Systems As per HPI     Objective:   Physical Exam BP 174/105  Pulse 90  Temp 98.5 F (36.9 C) (Oral)  Ht 5\' 4"  (1.626 m)  Wt 124 lb (56.246 kg)  BMI 21.28 kg/m2 General appearance: alert, cooperative and no distress Eyes: conjunctivae/corneas clear. PERRL, EOM's intact. Fundi benign. Lungs: clear to auscultation bilaterally Heart: regular rate and rhythm, S1 normal,  deceased S2. No murmur, click, rub or gallop Extremities: extremities normal, atraumatic, no cyanosis or edema  Assessment and Plan:

## 2011-11-02 NOTE — Assessment & Plan Note (Signed)
A: elevated LDL. P: lifestyle modifications per results letter.

## 2011-11-02 NOTE — Assessment & Plan Note (Signed)
A: improved on zoloft. No Suicidal ideation P: continue zoloft at current dose.

## 2013-10-04 ENCOUNTER — Telehealth: Payer: Self-pay | Admitting: Family Medicine

## 2013-10-04 NOTE — Telephone Encounter (Signed)
No answer and no machine. Mindy Mercer Lenetta Piche

## 2013-10-04 NOTE — Telephone Encounter (Signed)
I do not feel comfortable with prescribing medication that has not been seen for that long of a time. Clearly she has not been getting that medication from [place] during the last year. If she needs meds over the weekend she should ask whoever has been prescribing them in the past because it has not been me. [facility], MD

## 2013-10-04 NOTE — Telephone Encounter (Signed)
Pt called and would like enough on her lisinopril for the weekend. She has not been her in 2 years and made an appointment for Monday morning. jw

## 2013-10-07 ENCOUNTER — Encounter: Payer: Self-pay | Admitting: Family Medicine

## 2013-10-07 ENCOUNTER — Ambulatory Visit (INDEPENDENT_AMBULATORY_CARE_PROVIDER_SITE_OTHER): Payer: Medicaid Other | Admitting: Family Medicine

## 2013-10-07 VITALS — BP 180/100 | HR 83 | Temp 98.8°F | Ht 64.0 in | Wt 120.0 lb

## 2013-10-07 DIAGNOSIS — R5383 Other fatigue: Secondary | ICD-10-CM

## 2013-10-07 DIAGNOSIS — Z0000 Encounter for general adult medical examination without abnormal findings: Secondary | ICD-10-CM | POA: Insufficient documentation

## 2013-10-07 DIAGNOSIS — Z79899 Other long term (current) drug therapy: Secondary | ICD-10-CM

## 2013-10-07 DIAGNOSIS — R5381 Other malaise: Secondary | ICD-10-CM

## 2013-10-07 DIAGNOSIS — I10 Essential (primary) hypertension: Secondary | ICD-10-CM

## 2013-10-07 DIAGNOSIS — E7800 Pure hypercholesterolemia, unspecified: Secondary | ICD-10-CM

## 2013-10-07 DIAGNOSIS — E785 Hyperlipidemia, unspecified: Secondary | ICD-10-CM

## 2013-10-07 DIAGNOSIS — E079 Disorder of thyroid, unspecified: Secondary | ICD-10-CM

## 2013-10-07 LAB — CBC WITH DIFFERENTIAL/PLATELET
BASOS PCT: 1 % (ref 0–1)
Basophils Absolute: 0.1 10*3/uL (ref 0.0–0.1)
Eosinophils Absolute: 0.1 10*3/uL (ref 0.0–0.7)
Eosinophils Relative: 2 % (ref 0–5)
HEMATOCRIT: 38.9 % (ref 36.0–46.0)
HEMOGLOBIN: 12.9 g/dL (ref 12.0–15.0)
LYMPHS PCT: 34 % (ref 12–46)
Lymphs Abs: 2 10*3/uL (ref 0.7–4.0)
MCH: 25.5 pg — ABNORMAL LOW (ref 26.0–34.0)
MCHC: 33.2 g/dL (ref 30.0–36.0)
MCV: 76.9 fL — AB (ref 78.0–100.0)
MONO ABS: 0.2 10*3/uL (ref 0.1–1.0)
MONOS PCT: 4 % (ref 3–12)
NEUTROS ABS: 3.5 10*3/uL (ref 1.7–7.7)
Neutrophils Relative %: 59 % (ref 43–77)
Platelets: 152 10*3/uL (ref 150–400)
RBC: 5.06 MIL/uL (ref 3.87–5.11)
RDW: 14.5 % (ref 11.5–15.5)
WBC: 5.9 10*3/uL (ref 4.0–10.5)

## 2013-10-07 LAB — LIPID PANEL
CHOLESTEROL: 218 mg/dL — AB (ref 0–200)
HDL: 94 mg/dL (ref >39)
LDL Cholesterol: 111 mg/dL — ABNORMAL HIGH (ref 0–99)
Total CHOL/HDL Ratio: 2.3 Ratio
Triglycerides: 67 mg/dL (ref <150)
VLDL: 13 mg/dL (ref 0–40)

## 2013-10-07 LAB — BASIC METABOLIC PANEL
BUN: 11 mg/dL (ref 6–23)
CALCIUM: 9.7 mg/dL (ref 8.4–10.5)
CO2: 28 meq/L (ref 19–32)
CREATININE: 0.66 mg/dL (ref 0.50–1.10)
Chloride: 104 mEq/L (ref 96–112)
Glucose, Bld: 88 mg/dL (ref 70–99)
Potassium: 3.7 mEq/L (ref 3.5–5.3)
Sodium: 142 mEq/L (ref 135–145)

## 2013-10-07 LAB — TSH: TSH: 0.633 u[IU]/mL (ref 0.350–4.500)

## 2013-10-07 MED ORDER — LISINOPRIL 30 MG PO TABS: 30.0000 mg | ORAL_TABLET | Freq: Every day | ORAL | Status: DC

## 2013-10-07 MED ORDER — ENSURE NUTRITION SHAKE PO LIQD: 1.0000 | Freq: Every day | ORAL | Status: AC

## 2013-10-07 NOTE — Assessment & Plan Note (Signed)
A: Chronic, worsened; possible component of anxiety but cont to be elevated on multiple checks in office; Appears to be complaint with medication (but has not had refill from [place] in unknown length of time?) Does not monitor as outpt (is fearful of what the numbers might show her) P: attempted to reinforce that a person does not always experience the effects of high BP Increased lisinopril to 30 Pt to rtc in 2 weeks for BP check per nursing  Would maximize ACE first before adding additional agent (i.e.hctz) Recheck Cr given use of ACE Dietary modifications

## 2013-10-07 NOTE — Assessment & Plan Note (Signed)
A: noted elevated Tchol and LDL at last visit; pt still attesting to difficulty with lifestyle choices, but also has little appetite? P: repeat labs today Suspect will be hesistant to want to start statin (but will f/up prn)

## 2013-10-07 NOTE — Progress Notes (Signed)
Patient ID: Mindy Mercer, female   DOB: [birth year], 63 y.o.   MRN: [id number]   [facility] Bernadene Bell, MD Phone: [phone]  Subjective:  Mindy Mercer is a 63 y.o F who presents today for an annual visit (has not been seen in approx 2 years)  # HTN -was requesting refill of lisinopril over the weekend, but had not been seen in our clinic in at least 2 years -denies SOB, palps, chest discomfort -has been taking medicine daily, reports 100% compliance -does not routinely check as an outpt    #HLD -discussed with Dr. Adrian Blackwater at last visit elevated Tchol and LDL, pt had admitted to eating Fast foods at least 1 per day  - still reports having little appetite and making the wrong food choices 2/2 stress -not currently taking any medications, prefers to be on as little as possible  #anxiety -feels stress with all the demands of her daily life -states that her hair has been falling out lately -has known anxiety, does not like medications -was previously given zoloft rx but only took for 2-3 times and did not like it as it made her feel funny  #HCM -unknown time of colonoscopy, mammogram and pap smear  Past Medical History Patient Active Problem List   Diagnosis Date Noted  . ANXIETY DEPRESSION 02/20/2008  . POSTMENOPAUSAL SYNDROME 11/26/2007  . HYPERCHOLESTEROLEMIA 10/18/2007  . UNSPECIFIED DISORDER OF THYROID 10/17/2007  . ANEMIA 10/17/2007  . HYPERTENSION, BENIGN ESSENTIAL 10/17/2007  . CERVICALGIA 05/10/2007   Reviewed problem list.  Medications- reviewed and updated Chief complaint-noted No additions to family history Social history- patient is a never smoker  Objective: BP 180/100  Pulse 83  Temp(Src) 98.8 F (37.1 C) (Oral)  Ht 5\' 4"  (1.626 m)  Wt 120 lb (54.432 kg)  BMI 20.59 kg/m2 Gen: NAD, alert, cooperative with exam, tearful upon talking about her stress  HEENT: NCAT, EOMI, PERRL Neck: FROM, supple CV: RRR, good S1/S2, no murmur,  cap refill <3 Resp: CTABL, no wheezes, non-labored Abd: SNTND, BS present, no guarding or organomegaly Ext: No edema, warm, normal tone, moves UE/LE spontaneously, thin  Neuro: Alert and oriented, No gross deficits Skin: no rashes no lesions  Assessment/Plan: See problem based a/p

## 2013-10-07 NOTE — Patient Instructions (Signed)
Ms Docter it was great to meet you today!  I am so sorry to hear about all the stress that you have been under lately. I know you care quite a bit about your family. Please make sure that you are taking enough time to take care of yourself Please increase your lisinopril to 30mg  daily If you can I would recommend taking your BP at least once a week at a place like [facility] or [facility]. (even if you feel ok your BP could still be high) Please come back for a nursing BP check in 2 weeks time.  Please also schedule an appointment for yearly pap smear I will call you with the results of the labwork  Looking forward to seeing you soon Bernadene Bell, MD

## 2013-10-07 NOTE — Assessment & Plan Note (Signed)
A: reporting anxiety, fatigue and hair loss; unclear when last time she was euthyroid was P: will check TSH Pt not interested in medication (ie SSRI) for anxiety symptoms

## 2013-10-07 NOTE — Assessment & Plan Note (Signed)
Given information to RTC for pap Handout for mammogram provided Pt to call [facility] for new screening colonoscopy

## 2014-03-03 ENCOUNTER — Encounter: Payer: Self-pay | Admitting: Family Medicine

## 2016-06-24 ENCOUNTER — Telehealth: Payer: Self-pay | Admitting: Student

## 2016-06-24 NOTE — Telephone Encounter (Signed)
Home number is incorrect and it now belongs to someone else. - Mesha Guinyard

## 2019-06-01 ENCOUNTER — Emergency Department (HOSPITAL_COMMUNITY)
Admission: EM | Admit: 2019-06-01 | Discharge: 2019-06-01 | Disposition: A | Discharge status: Home / Self Care | Payer: Medicare Other | Attending: Emergency Medicine | Admitting: Emergency Medicine

## 2019-06-01 ENCOUNTER — Encounter (HOSPITAL_COMMUNITY): Payer: Self-pay | Admitting: Emergency Medicine

## 2019-06-01 ENCOUNTER — Other Ambulatory Visit: Payer: Self-pay

## 2019-06-01 DIAGNOSIS — Z79899 Other long term (current) drug therapy: Secondary | ICD-10-CM | POA: Insufficient documentation

## 2019-06-01 DIAGNOSIS — I10 Essential (primary) hypertension: Secondary | ICD-10-CM | POA: Diagnosis present

## 2019-06-01 LAB — CBC WITH DIFFERENTIAL/PLATELET
Abs Immature Granulocytes: 0.03 10*3/uL (ref 0.00–0.07)
Basophils Absolute: 0.1 10*3/uL (ref 0.0–0.1)
Basophils Relative: 1 %
Eosinophils Absolute: 0.1 10*3/uL (ref 0.0–0.5)
Eosinophils Relative: 1 %
HCT: 40.6 % (ref 36.0–46.0)
Hemoglobin: 12.7 g/dL (ref 12.0–15.0)
Immature Granulocytes: 0 %
Lymphocytes Relative: 29 %
Lymphs Abs: 2.6 10*3/uL (ref 0.7–4.0)
MCH: 26 pg (ref 26.0–34.0)
MCHC: 31.3 g/dL (ref 30.0–36.0)
MCV: 83 fL (ref 80.0–100.0)
Monocytes Absolute: 0.4 10*3/uL (ref 0.1–1.0)
Monocytes Relative: 4 %
Neutro Abs: 5.8 10*3/uL (ref 1.7–7.7)
Neutrophils Relative %: 65 %
Platelets: 125 10*3/uL — ABNORMAL LOW (ref 150–400)
RBC: 4.89 MIL/uL (ref 3.87–5.11)
RDW: 13.9 % (ref 11.5–15.5)
WBC: 8.9 10*3/uL (ref 4.0–10.5)
nRBC: 0 % (ref 0.0–0.2)

## 2019-06-01 LAB — BASIC METABOLIC PANEL
Anion gap: 10 (ref 5–15)
BUN: 17 mg/dL (ref 8–23)
CO2: 25 mmol/L (ref 22–32)
Calcium: 9.4 mg/dL (ref 8.9–10.3)
Chloride: 106 mmol/L (ref 98–111)
Creatinine, Ser: 0.57 mg/dL (ref 0.44–1.00)
GFR calc Af Amer: >60 mL/min (ref >60)
GFR calc non Af Amer: >60 mL/min (ref >60)
Glucose, Bld: 91 mg/dL (ref 70–99)
Potassium: 3.6 mmol/L (ref 3.5–5.1)
Sodium: 141 mmol/L (ref 135–145)

## 2019-06-01 MED ORDER — SODIUM CHLORIDE 0.9 % IV BOLUS
1000.0000 mL | Freq: Once | INTRAVENOUS | Status: AC
  Administered 2019-06-01: 1000 mL via INTRAVENOUS

## 2019-06-01 MED ORDER — LISINOPRIL 30 MG PO TABS: 30.0000 mg | ORAL_TABLET | Freq: Every day | ORAL | 11 refills | Status: AC

## 2019-06-01 NOTE — ED Notes (Signed)
Pt able to ambulate to restroom without assistance

## 2019-06-01 NOTE — Discharge Instructions (Signed)
Follow up with your doctor in the office.  

## 2019-06-01 NOTE — ED Triage Notes (Signed)
Patient c/o dry skin and mouth. States "I know I'm dehydrated." Denies N/V/D, chest pain, headache, SOB, fever, cough. Also reports "I know my blood pressure is high. I ran out of my medicine from three years ago." Hypertensive in triage. States "I have been having more anxiety too."

## 2019-06-01 NOTE — ED Provider Notes (Signed)
[facility] DEPT Provider Note   CSN: [id number] Arrival date & time: 06/01/19  1811     History Chief Complaint  Patient presents with  . Hypertension    Mindy Mercer is a 69 y.o. female.  69 yo F with a chief complaint of dehydration.  Patient states that her skin is very dry and she feels that her mouth is dry.  She has not been drinking that much.  This been going on for months.  She checked her blood pressure today and noted it was elevated.  She was on blood pressure medication in the past but stopped taking it about 3 years ago.  She not sure why.  She is on 2 medicines at that time and felt that 1 made her feel bad and so she did stop that one much sooner in the course.  She has an appointment in a couple weeks with her family doctor.  Decided she should come here to be evaluated.  The history is provided by the patient.  Hypertension This is a new problem. The current episode started more than 1 week ago. The problem occurs constantly. Pertinent negatives include no chest pain, no abdominal pain, no headaches and no shortness of breath. Nothing aggravates the symptoms. Nothing relieves the symptoms. She has tried nothing for the symptoms. The treatment provided no relief.       Past Medical History:  Diagnosis Date  . Anemia 1970  . Blood transfusion 1970  . Cataracts, bilateral 2006  . Fibroid tumor 1987  . Hypertension 2011  . Migraines 1986   On disability for migraines. Has been treated with inderal in the past.     Patient Active Problem List   Diagnosis Date Noted  . Health care maintenance 10/07/2013  . ANXIETY DEPRESSION 02/20/2008  . POSTMENOPAUSAL SYNDROME 11/26/2007  . HYPERCHOLESTEROLEMIA 10/18/2007  . UNSPECIFIED DISORDER OF THYROID 10/17/2007  . ANEMIA 10/17/2007  . HYPERTENSION, BENIGN ESSENTIAL 10/17/2007  . CERVICALGIA 05/10/2007    History reviewed. No pertinent surgical history.   OB History    Gravida  4     Para  4   Term  4   Preterm      AB      Living  4     SAB      TAB      Ectopic      Multiple      Live Births              Family History  Problem Relation Age of Onset  . Hypertension Mother   . Ulcers Father   . Dementia Sister   . Migraines Sister   . Fibroids Sister     Social History   Tobacco Use  . Smoking status: Never Smoker  . Smokeless tobacco: Never Used  Substance Use Topics  . Alcohol use: No  . Drug use: No    Home Medications Prior to Admission medications   Medication Sig Start Date End Date Taking? Authorizing Provider  Aspirin-Acetaminophen-Caffeine (EXCEDRIN PO) Take 1 tablet by mouth daily as needed (pain).   Yes [provider]  Nutritional Supplements (ENSURE NUTRITION SHAKE) LIQD Take 1 Can by mouth daily. 10/07/13  Yes Bernadene Bell, MD  lisinopril (ZESTRIL) 30 MG tablet Take 1 tablet (30 mg total) by mouth daily. 06/01/19 05/31/20  Deno Etienne, DO    Allergies    Patient has no known allergies.  Review of Systems   Review of  Systems  Constitutional: Positive for fatigue. Negative for chills and fever.       Feels dry  HENT: Negative for congestion and rhinorrhea.   Eyes: Negative for redness and visual disturbance.  Respiratory: Negative for shortness of breath and wheezing.   Cardiovascular: Negative for chest pain and palpitations.  Gastrointestinal: Negative for abdominal pain, nausea and vomiting.  Genitourinary: Negative for dysuria and urgency.  Musculoskeletal: Negative for arthralgias and myalgias.  Skin: Negative for pallor and wound.  Neurological: Negative for dizziness and headaches.    Physical Exam Updated Vital Signs BP (!) 174/97 (BP Location: Right Arm)   Pulse 86   Temp 98.8 F (37.1 C) (Oral)   Resp 14   SpO2 100%   Physical Exam Vitals and nursing note reviewed.  Constitutional:      General: She is not in acute distress.    Appearance: She is well-developed. She is not  diaphoretic.  HENT:     Head: Normocephalic and atraumatic.  Eyes:     Pupils: Pupils are equal, round, and reactive to light.  Cardiovascular:     Rate and Rhythm: Normal rate and regular rhythm.     Heart sounds: No murmur. No friction rub. No gallop.   Pulmonary:     Effort: Pulmonary effort is normal.     Breath sounds: No wheezing or rales.  Abdominal:     General: There is no distension.     Palpations: Abdomen is soft.     Tenderness: There is no abdominal tenderness.  Musculoskeletal:        General: No tenderness.     Cervical back: Normal range of motion and neck supple.  Skin:    General: Skin is warm and dry.  Neurological:     Mental Status: She is alert and oriented to person, place, and time.  Psychiatric:        Behavior: Behavior normal.     ED Results / Procedures / Treatments   Labs (all labs ordered are listed, but only abnormal results are displayed) Labs Reviewed  CBC WITH DIFFERENTIAL/PLATELET - Abnormal; Notable for the following components:      Result Value   Platelets 125 (*)    All other components within normal limits  BASIC METABOLIC PANEL    EKG EKG Interpretation  Date/Time:  Saturday June 01 2019 19:51:04 EST Ventricular Rate:  94 PR Interval:    QRS Duration: 100 QT Interval:  369 QTC Calculation: 462 R Axis:   -8 Text Interpretation: Sinus rhythm Ventricular premature complex Biatrial enlargement RSR' in V1 or V2, right VCD or RVH Minimal ST elevation, anterior leads Baseline wander in lead(s) II III aVF V5 No significant change since last tracing Confirmed by Deno Etienne [phone]) on 06/01/2019 8:04:02 PM   Radiology No results found.  Procedures Procedures (including critical care time)  Medications Ordered in ED Medications  sodium chloride 0.9 % bolus 1,000 mL (has no administration in time range)    ED Course  I have reviewed the triage vital signs and the nursing notes.  Pertinent labs & imaging results that were  available during my care of the patient were reviewed by me and considered in my medical decision making (see chart for details).    MDM Rules/Calculators/A&P                      69 yo F with chief complaints of feeling like she is dehydrated.  Patient is well-appearing and  nontoxic.  Has moist mucous membranes.  No skin tenting.  I am unsure why she came today it sounds like she has had symptoms for quite some time.  Lab work appears unremarkable.  Was performed due to tachycardia upon arrival.  Bolus of IV fluids with improvement of her tachycardia.  We will start her on lisinopril.  This is the last medicine that she has documented that she was on.  Have her follow-up with her family doctor.  10:24 PM:  I have discussed the diagnosis/risks/treatment options with the patient and believe the pt to be eligible for discharge home to follow-up with PCP. We also discussed returning to the ED immediately if new or worsening sx occur. We discussed the sx which are most concerning (e.g., sudden worsening pain, fever, inability to tolerate by mouth\) that necessitate immediate return. Medications administered to the patient during their visit and any new prescriptions provided to the patient are listed below.  Medications given during this visit Medications  sodium chloride 0.9 % bolus 1,000 mL (has no administration in time range)     The patient appears reasonably screen and/or stabilized for discharge and I doubt any other medical condition or other [facility] requiring further screening, evaluation, or treatment in the ED at this time prior to discharge.   Final Clinical Impression(s) / ED Diagnoses Final diagnoses:  Hypertension, unspecified type    Rx / DC Orders ED Discharge Orders         Ordered    lisinopril (ZESTRIL) 30 MG tablet  Daily     06/01/19 2207           Deno Etienne, DO 06/01/19 2224

## 2019-06-02 ENCOUNTER — Telehealth (HOSPITAL_COMMUNITY): Payer: Self-pay | Admitting: Emergency Medicine

## 2019-06-02 MED ORDER — LISINOPRIL 30 MG PO TABS: 30.0000 mg | ORAL_TABLET | Freq: Every day | ORAL | 0 refills | Status: AC

## 2019-06-02 NOTE — Telephone Encounter (Signed)
Sent prescription

## 2020-04-07 ENCOUNTER — Ambulatory Visit (HOSPITAL_COMMUNITY)
Admission: EM | Admit: 2020-04-07 | Discharge: 2020-04-07 | Disposition: A | Discharge status: Home / Self Care | Payer: Medicare Other | Attending: Urgent Care | Admitting: Urgent Care

## 2020-04-07 ENCOUNTER — Other Ambulatory Visit: Payer: Self-pay

## 2020-04-07 ENCOUNTER — Ambulatory Visit (INDEPENDENT_AMBULATORY_CARE_PROVIDER_SITE_OTHER): Payer: Medicare Other

## 2020-04-07 DIAGNOSIS — M542 Cervicalgia: Secondary | ICD-10-CM

## 2020-04-07 DIAGNOSIS — R0789 Other chest pain: Secondary | ICD-10-CM

## 2020-04-07 DIAGNOSIS — R0781 Pleurodynia: Secondary | ICD-10-CM

## 2020-04-07 DIAGNOSIS — M546 Pain in thoracic spine: Secondary | ICD-10-CM

## 2020-04-07 DIAGNOSIS — M79622 Pain in left upper arm: Secondary | ICD-10-CM

## 2020-04-07 DIAGNOSIS — S161XXA Strain of muscle, fascia and tendon at neck level, initial encounter: Secondary | ICD-10-CM

## 2020-04-07 MED ORDER — METHOCARBAMOL 500 MG PO TABS: 500.0000 mg | ORAL_TABLET | Freq: Two times a day (BID) | ORAL | 0 refills | Status: AC | PRN

## 2020-04-07 MED ORDER — TRAMADOL HCL 50 MG PO TABS: 50.0000 mg | ORAL_TABLET | Freq: Four times a day (QID) | ORAL | 0 refills | Status: AC | PRN
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
## 2020-04-07 NOTE — Discharge Instructions (Signed)
Please schedule Tylenol at 500 mg - 650 mg once every 6 hours as needed for aches and pains.  If you still have pain despite taking Tylenol regularly, this is breakthrough pain.  You can use tramadol once every 6 hours for this.  Once your pain is better controlled, switch back to just Tylenol. It is okay to use methocarbamol with these medications as a muscle relaxant.
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
## 2020-04-16 ENCOUNTER — Ambulatory Visit (HOSPITAL_COMMUNITY)
Admission: EM | Admit: 2020-04-16 | Discharge: 2020-04-16 | Disposition: A | Discharge status: Home / Self Care | Payer: Medicare Other | Attending: Family Medicine | Admitting: Family Medicine

## 2020-04-16 ENCOUNTER — Other Ambulatory Visit: Payer: Self-pay

## 2020-04-16 ENCOUNTER — Ambulatory Visit (INDEPENDENT_AMBULATORY_CARE_PROVIDER_SITE_OTHER): Payer: Medicare Other

## 2020-04-16 ENCOUNTER — Encounter (HOSPITAL_COMMUNITY): Payer: Self-pay | Admitting: Emergency Medicine

## 2020-04-16 DIAGNOSIS — R200 Anesthesia of skin: Secondary | ICD-10-CM

## 2020-04-16 DIAGNOSIS — M47812 Spondylosis without myelopathy or radiculopathy, cervical region: Secondary | ICD-10-CM

## 2020-04-16 DIAGNOSIS — M542 Cervicalgia: Secondary | ICD-10-CM

## 2020-04-16 MED ORDER — NAPROXEN 375 MG PO TABS: 375.0000 mg | ORAL_TABLET | Freq: Three times a day (TID) | ORAL | 0 refills | Status: DC
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
## 2020-04-16 NOTE — ED Triage Notes (Signed)
Continued neck pain after visit on 04/07/2020.  Patient is also having "twitching" sensation in left hand.

## 2020-04-16 NOTE — Discharge Instructions (Addendum)
Follow up with your primary doctor for your high blood pressure See orthopedic if neck pain does not go away Take the naproxen 2-3 times a day with food

## 2020-05-10 ENCOUNTER — Other Ambulatory Visit: Payer: Self-pay

## 2020-05-10 ENCOUNTER — Ambulatory Visit (HOSPITAL_COMMUNITY)
Admission: EM | Admit: 2020-05-10 | Discharge: 2020-05-10 | Disposition: A | Discharge status: Home / Self Care | Payer: Medicare Other | Attending: Urgent Care | Admitting: Urgent Care

## 2020-05-10 ENCOUNTER — Encounter (HOSPITAL_COMMUNITY): Payer: Self-pay

## 2020-05-10 DIAGNOSIS — M5030 Other cervical disc degeneration, unspecified cervical region: Secondary | ICD-10-CM

## 2020-05-10 DIAGNOSIS — M542 Cervicalgia: Secondary | ICD-10-CM | POA: Diagnosis not present

## 2020-05-10 DIAGNOSIS — M549 Dorsalgia, unspecified: Secondary | ICD-10-CM

## 2020-05-10 MED ORDER — NAPROXEN 375 MG PO TABS: 375.0000 mg | ORAL_TABLET | Freq: Two times a day (BID) | ORAL | 0 refills | Status: AC
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
## 2020-10-25 LAB — EXTERNAL GENERIC LAB PROCEDURE: COLOGUARD: NEGATIVE

## 2022-01-09 IMAGING — DX DG RIBS W/ CHEST 3+V*L*
3 series · 3 of 3 positions shown · non-contrast
Comparison: None available.

CLINICAL DATA: 69-year-old female with left rib pain.

EXAM:
LEFT RIBS AND CHEST - 3+ VIEW

[chest pa]
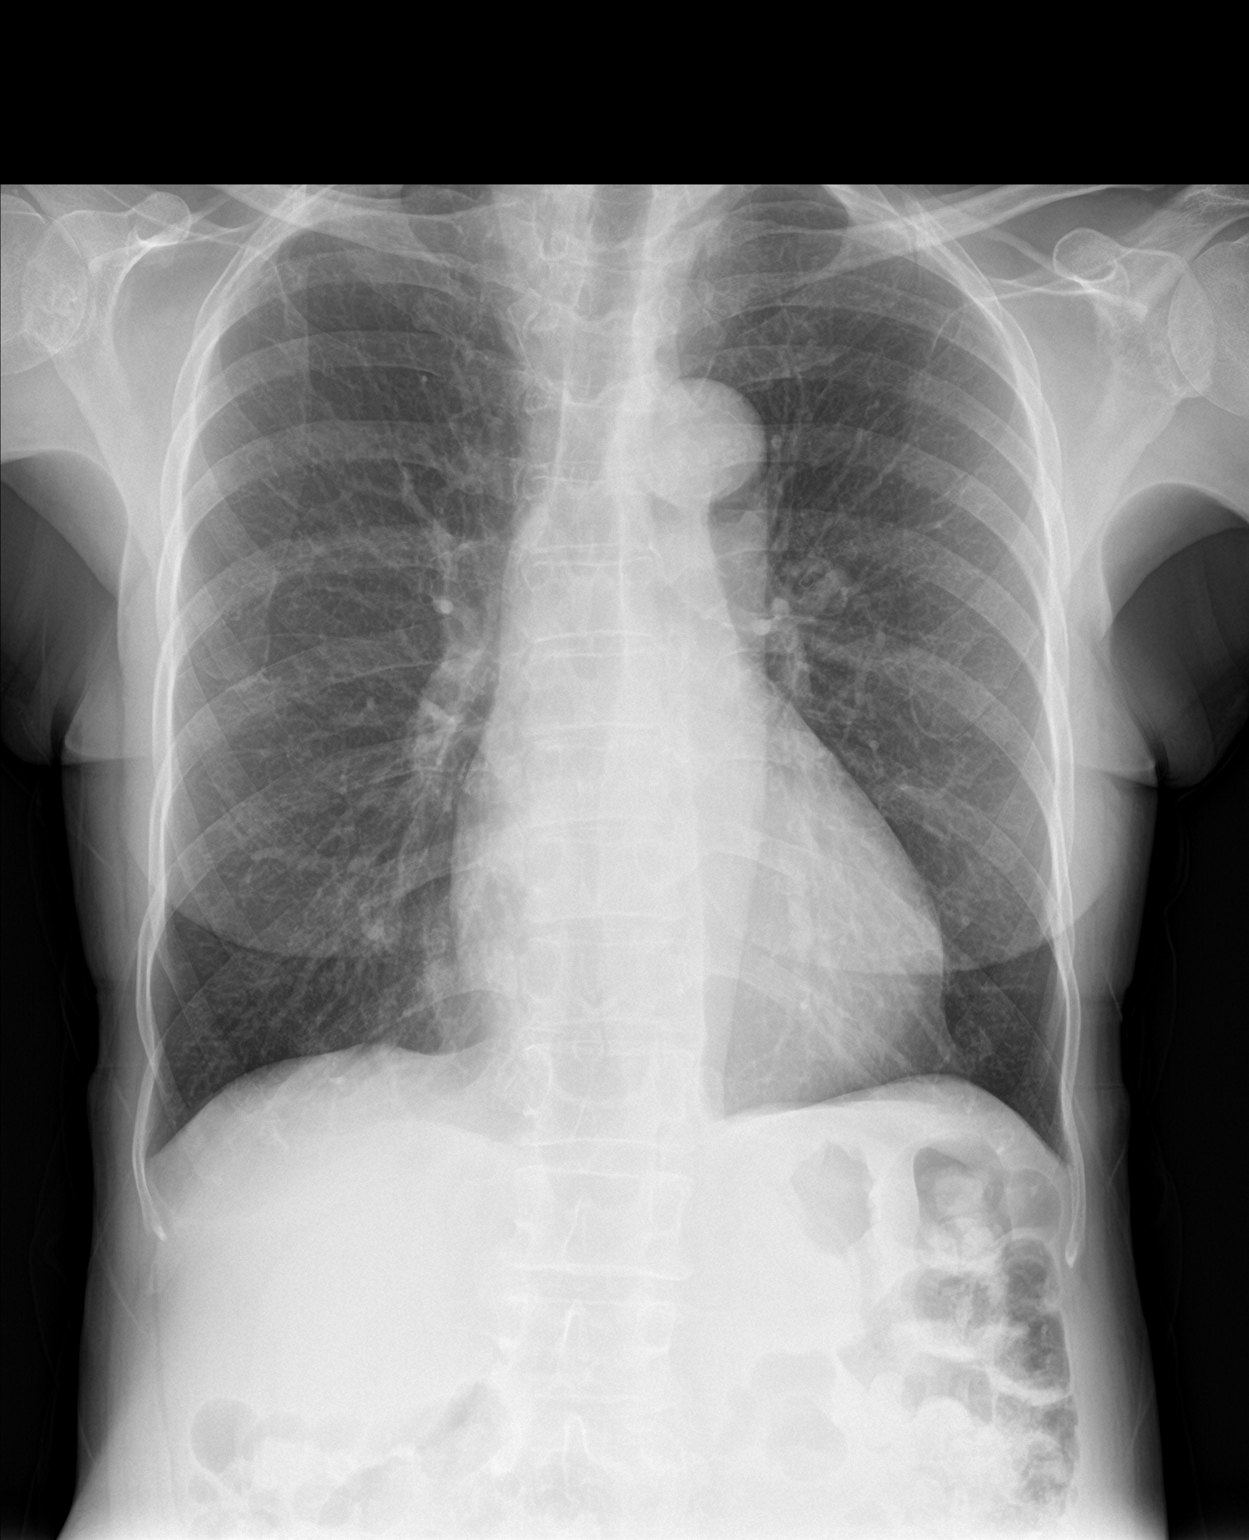

[rib obl]
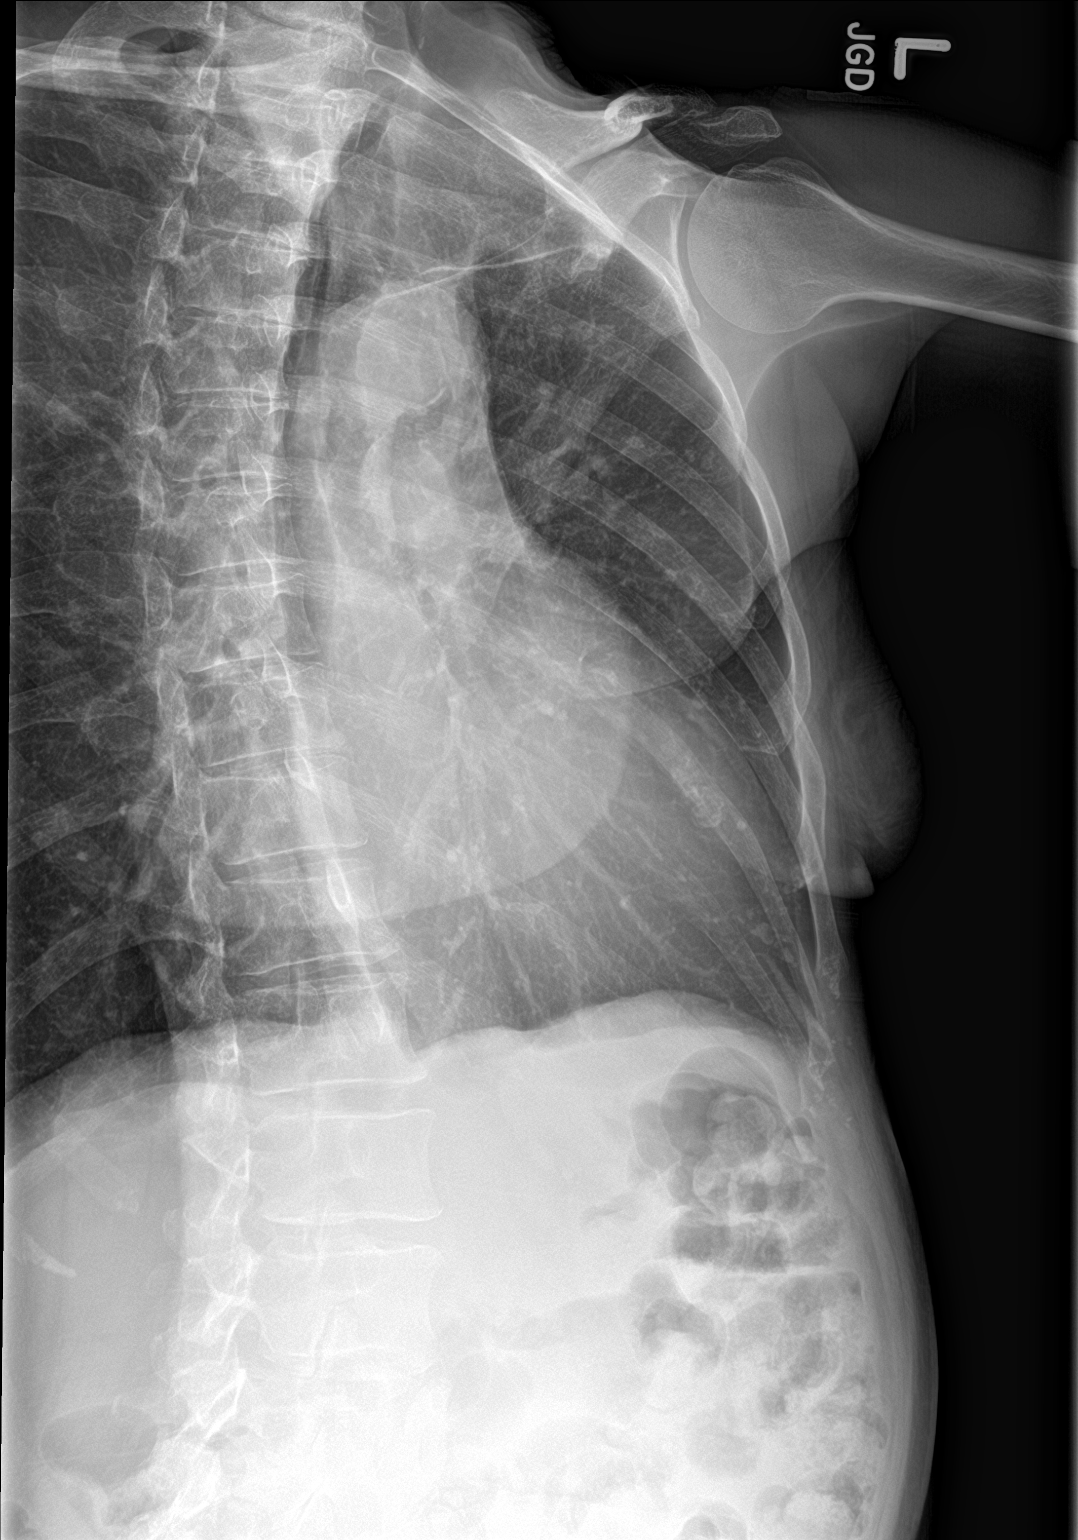

[rib ap]
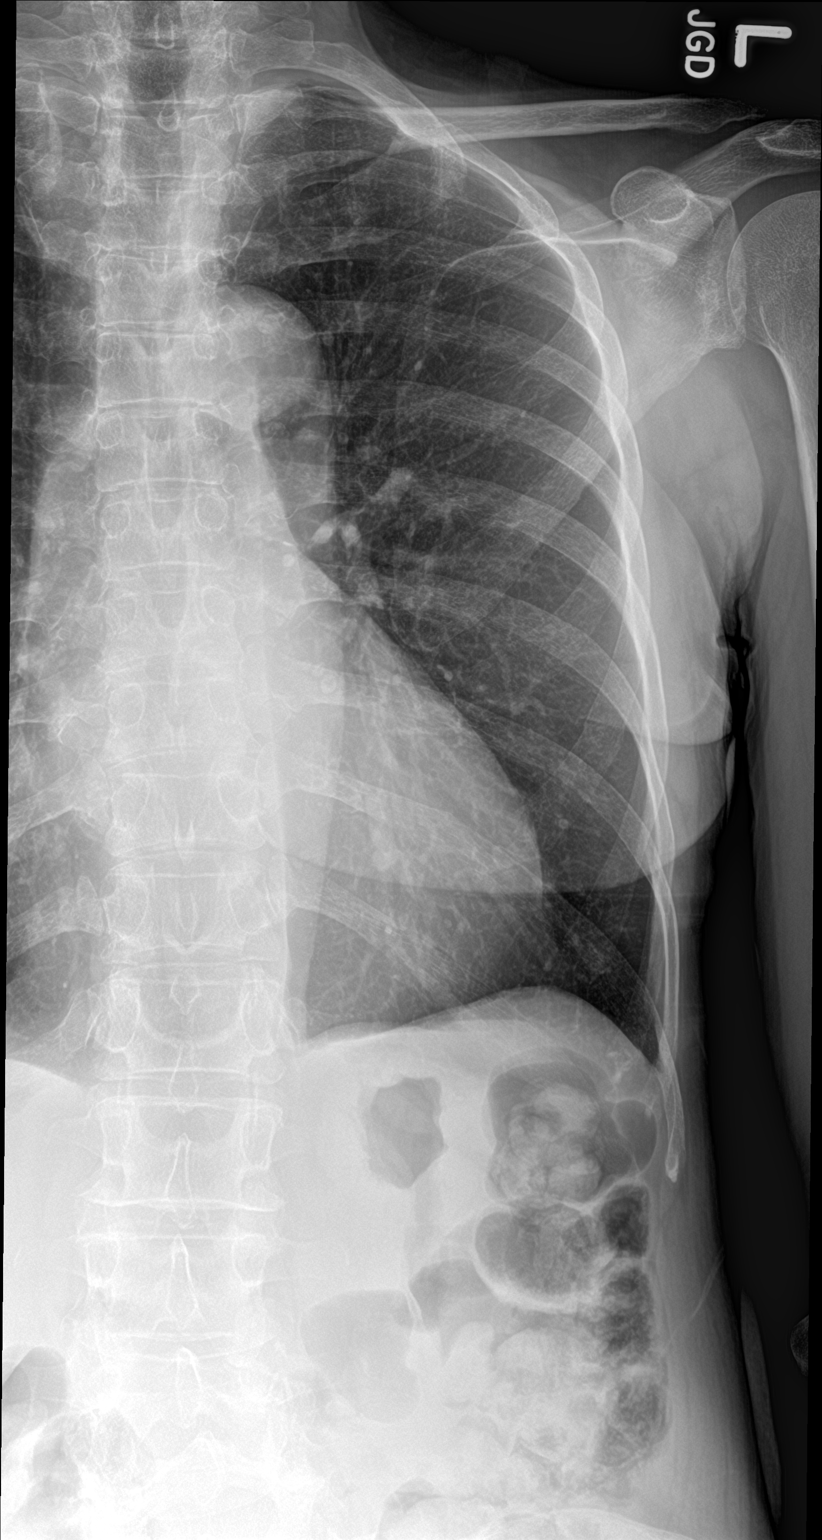

[3 of 3 positions shown; findings below may reference images not displayed]

FINDINGS: Mild cardiomegaly. Other mediastinal contours are within normal
limits. Visualized tracheal air column is within normal limits.
Large lung volumes. Both lungs appear clear. No pneumothorax or
pleural effusion.

Negative visible bowel gas pattern.

Bone mineralization is within normal limits for age. Oblique views
of the left ribs. No left rib fracture or rib lesion identified. No
acute osseous abnormality identified.
IMPRESSION: 1. No left rib fracture or osseous abnormality identified.
2. Mild cardiomegaly.  Possible pulmonary hyperinflation.

## 2022-01-18 IMAGING — DX DG CERVICAL SPINE COMPLETE 4+V
5 series · 5 of 5 positions shown · non-contrast
Comparison: Cervical spine radiograph dated 05/10/2007

CLINICAL DATA: 69-year-old female with left hand numbness.

EXAM:
CERVICAL SPINE - COMPLETE 4+ VIEW

[c-spine lat]
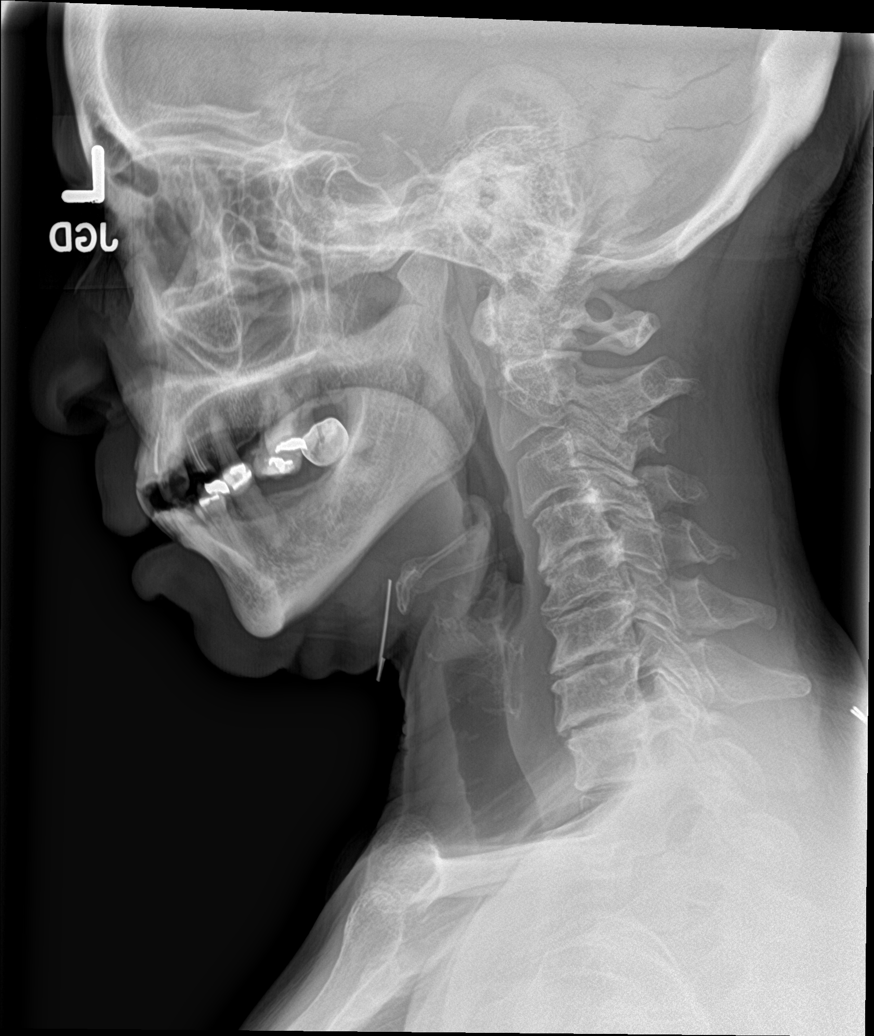

[c-spine obl (1 of 2)]
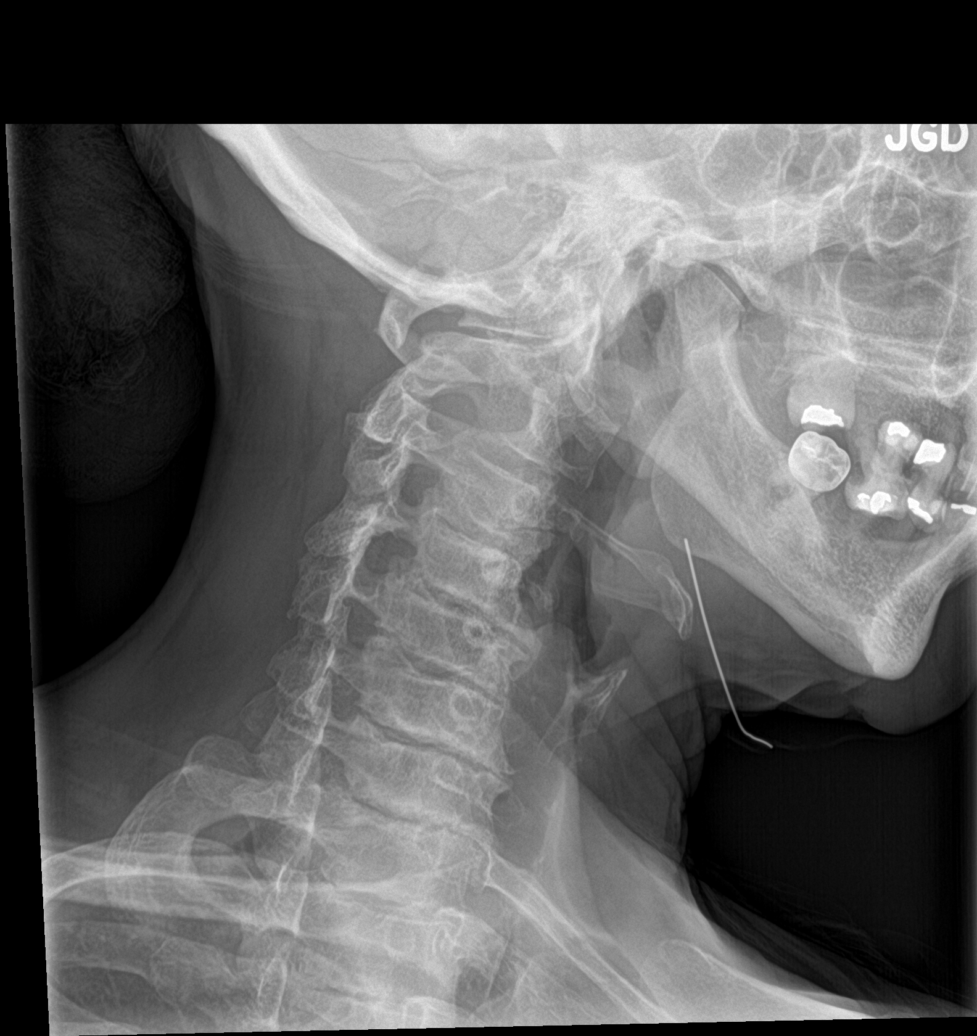

[c-spine obl (2 of 2)]
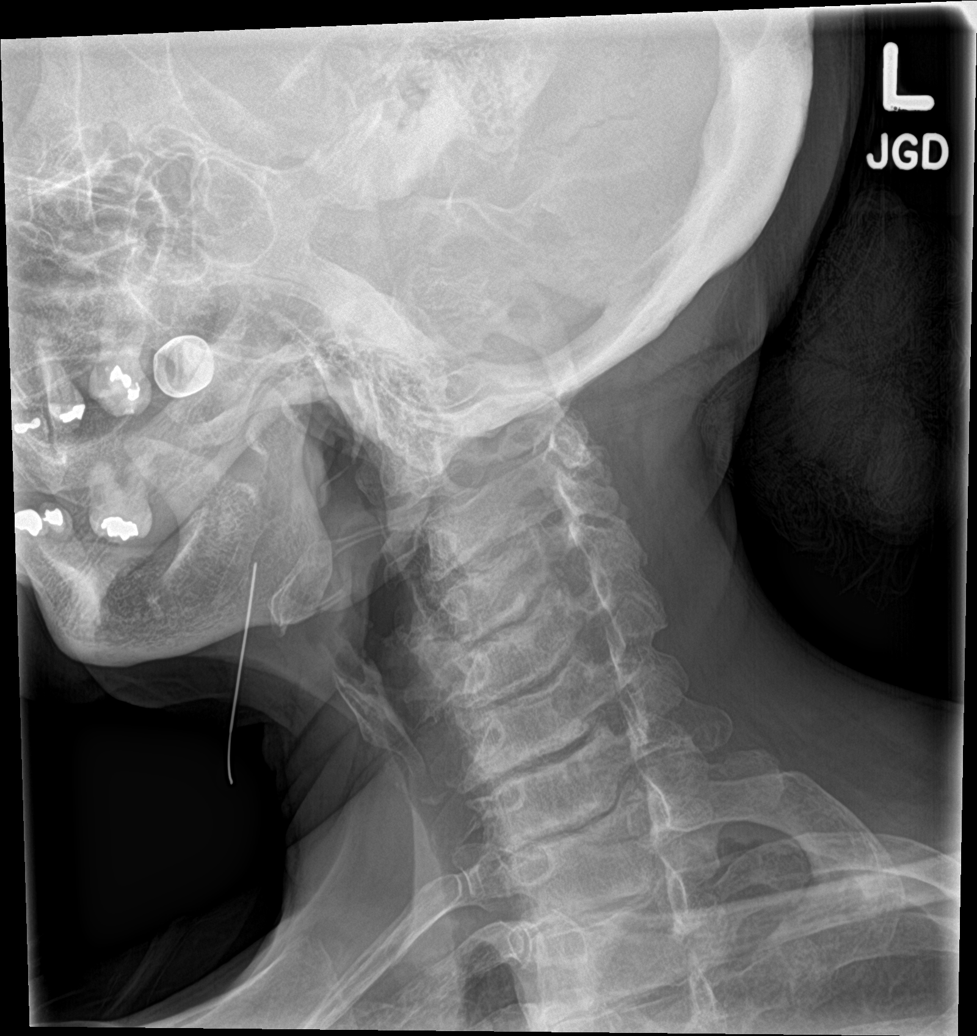

[c-spine ap]
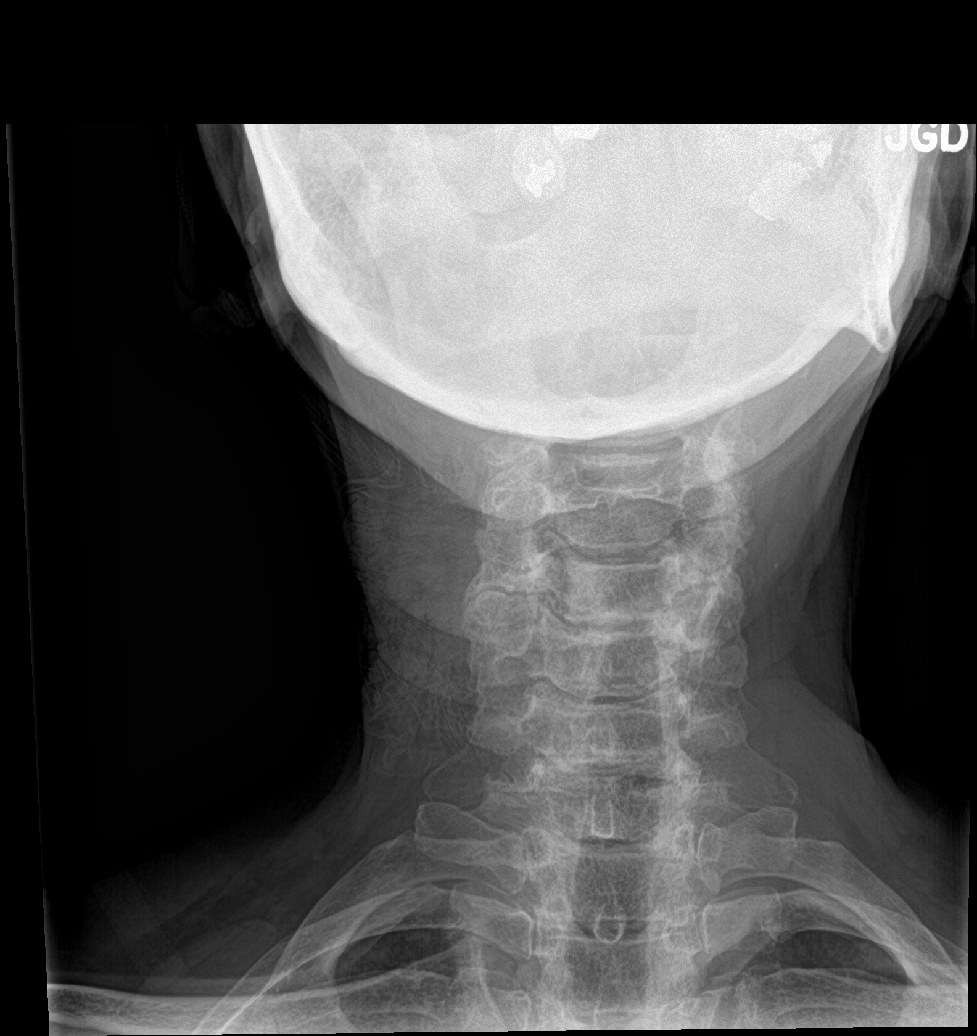

[c-spine open mouth]
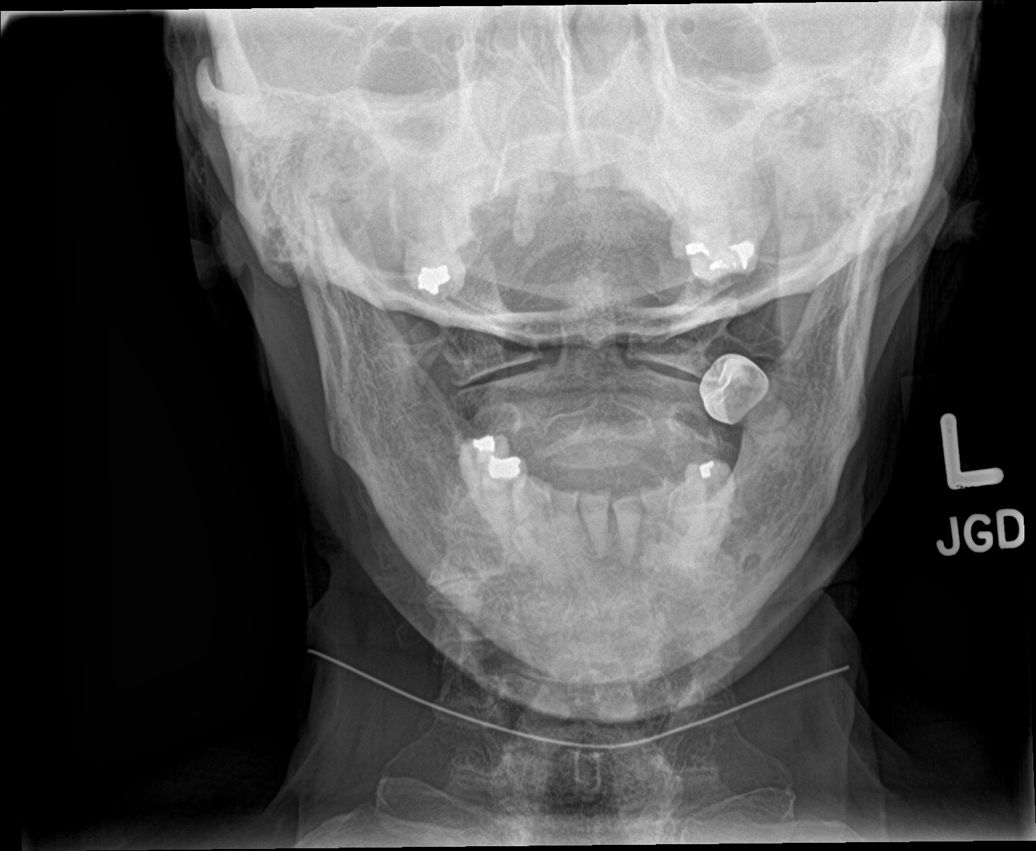

[5 of 5 positions shown; findings below may reference images not displayed]

FINDINGS: There is no acute fracture or subluxation of the cervical spine.
There is straightening of normal cervical lordosis which may be
positional or due to muscle spasm. Multilevel degenerative changes
with endplate irregularity and disc space narrowing primarily at
C4-C7. The visualized posterior elements and odontoid appear intact.
There is anatomic alignment of the lateral masses of C1 and C2. The
soft tissues are unremarkable.
IMPRESSION: 1. No acute/traumatic cervical spine pathology.
2. Multilevel degenerative changes.

## 2022-05-14 ENCOUNTER — Other Ambulatory Visit: Payer: Self-pay

## 2022-05-14 ENCOUNTER — Emergency Department (HOSPITAL_COMMUNITY)
Admission: EM | Admit: 2022-05-14 | Discharge: 2022-05-15 | Disposition: A | Discharge status: Home / Self Care | Payer: 59 | Attending: Emergency Medicine | Admitting: Emergency Medicine

## 2022-05-14 DIAGNOSIS — I10 Essential (primary) hypertension: Secondary | ICD-10-CM | POA: Insufficient documentation

## 2022-05-14 DIAGNOSIS — F419 Anxiety disorder, unspecified: Secondary | ICD-10-CM | POA: Diagnosis present

## 2022-05-14 DIAGNOSIS — Z79899 Other long term (current) drug therapy: Secondary | ICD-10-CM | POA: Diagnosis not present

## 2022-05-15 ENCOUNTER — Emergency Department (HOSPITAL_COMMUNITY): Payer: 59

## 2022-05-15 DIAGNOSIS — F419 Anxiety disorder, unspecified: Secondary | ICD-10-CM | POA: Diagnosis not present

## 2022-05-15 LAB — CBC WITH DIFFERENTIAL/PLATELET
Abs Immature Granulocytes: 0.01 10*3/uL (ref 0.00–0.07)
Basophils Absolute: 0 10*3/uL (ref 0.0–0.1)
Basophils Relative: 1 %
Eosinophils Absolute: 0.2 10*3/uL (ref 0.0–0.5)
Eosinophils Relative: 2 %
HCT: 37.9 % (ref 36.0–46.0)
Hemoglobin: 11.5 g/dL — ABNORMAL LOW (ref 12.0–15.0)
Immature Granulocytes: 0 %
Lymphocytes Relative: 39 %
Lymphs Abs: 2.8 10*3/uL (ref 0.7–4.0)
MCH: 25.7 pg — ABNORMAL LOW (ref 26.0–34.0)
MCHC: 30.3 g/dL (ref 30.0–36.0)
MCV: 84.6 fL (ref 80.0–100.0)
Monocytes Absolute: 0.4 10*3/uL (ref 0.1–1.0)
Monocytes Relative: 6 %
Neutro Abs: 3.8 10*3/uL (ref 1.7–7.7)
Neutrophils Relative %: 52 %
Platelets: 132 10*3/uL — ABNORMAL LOW (ref 150–400)
RBC: 4.48 MIL/uL (ref 3.87–5.11)
RDW: 13.5 % (ref 11.5–15.5)
WBC: 7.2 10*3/uL (ref 4.0–10.5)
nRBC: 0 % (ref 0.0–0.2)

## 2022-05-15 LAB — TSH: TSH: 0.419 u[IU]/mL (ref 0.350–4.500)

## 2022-05-15 LAB — BASIC METABOLIC PANEL
Anion gap: 9 (ref 5–15)
BUN: 14 mg/dL (ref 8–23)
CO2: 27 mmol/L (ref 22–32)
Calcium: 9.2 mg/dL (ref 8.9–10.3)
Chloride: 103 mmol/L (ref 98–111)
Creatinine, Ser: 0.65 mg/dL (ref 0.44–1.00)
GFR, Estimated: >60 mL/min (ref >60)
Glucose, Bld: 105 mg/dL — ABNORMAL HIGH (ref 70–99)
Potassium: 3.4 mmol/L — ABNORMAL LOW (ref 3.5–5.1)
Sodium: 139 mmol/L (ref 135–145)

## 2022-05-15 LAB — TROPONIN I (HIGH SENSITIVITY)
Troponin I (High Sensitivity): 4 ng/L (ref <18)
Troponin I (High Sensitivity): 5 ng/L (ref <18)

## 2022-05-15 MED ORDER — CLONIDINE HCL 0.1 MG PO TABS
0.1000 mg | ORAL_TABLET | Freq: Once | ORAL | Status: AC
  Administered 2022-05-15: 0.1 mg via ORAL
  Filled 2022-05-15: qty 1

## 2022-05-15 MED ORDER — LISINOPRIL 20 MG PO TABS
30.0000 mg | ORAL_TABLET | Freq: Once | ORAL | Status: AC
  Administered 2022-05-15: 30 mg via ORAL
  Filled 2022-05-15: qty 1

## 2022-05-15 NOTE — ED Provider Triage Note (Signed)
Emergency Medicine Provider Triage Evaluation Note  Mindy Mercer , a 72 y.o. female  was evaluated in triage.  Pt complains of a lot of anxiety.  Admits to a lot of stress recently.  Also states BP has been elevated.  She has been complaint with her lisinopril.  Denies chest pain or SOB.  Review of Systems  Positive: Anxiety, HTN Negative: fever  Physical Exam  BP (!) 159/117 (BP Location: Right Arm)   Pulse 91   Temp 98.4 F (36.9 C)   Resp 16   SpO2 100%   Gen:   Awake, no distress   Resp:  Normal effort  MSK:   Moves extremities without difficulty  Other:    Medical Decision Making  Medically screening exam initiated at 12:02 AM.  Appropriate orders placed.  Mindy Mercer was informed that the remainder of the evaluation will be completed by another provider, this initial triage assessment does not replace that evaluation, and the importance of remaining in the ED until their evaluation is complete.  Anxiety, HTN.  Denies chest pain or SOB.  EKG, labs, CXR.   Mindy Pickett, PA-C 05/15/22 [id number]

## 2022-05-15 NOTE — ED Triage Notes (Signed)
Pt arrives with c/o anxiety and anxiousness. Pt does not take meds for anxiety. Pt denies CP or SOB.

## 2022-05-15 NOTE — ED Provider Notes (Addendum)
[facility] Provider Note   CSN: [id number] Arrival date & time: 05/14/22  2351     History  Chief Complaint  Patient presents with   Anxiety    Mindy Mercer is a 72 y.o. female with medical history of hypertension, migraines, anemia.  Patient presents to ED for evaluation of hypertension, anxiety.  The patient reports that she has many life stressors at home to include a paraplegic son, unstable financial situation.  The patient reports that over the holidays these stressors were exacerbated due to having to move many of her deceased mother's possessions out of storage.  Patient states that her anxiety becomes so bad sometimes that she gets "shaky".  Patient was told by triage nurse that this could be related to her thyroid, she is requesting thyroid testing.  Patient also here with complaints of high blood pressure.  The patient states that she has not taken her blood pressure medication due to waiting in the department for a total time of 8 hours.  Patient reports she typically takes this in the morning, her home blood pressure medication has been ordered.  Patient denies any chest pain, shortness of breath, headache or blurred vision.  Patient denies any SI, HI, AVH.   Anxiety Pertinent negatives include no chest pain, no headaches and no shortness of breath.       Home Medications Prior to Admission medications   Medication Sig Start Date End Date Taking? Authorizing Provider  Aspirin-Acetaminophen-Caffeine (EXCEDRIN PO) Take 1 tablet by mouth daily as needed (pain).    [provider]  lisinopril (ZESTRIL) 30 MG tablet Take 1 tablet (30 mg total) by mouth daily. 06/01/19 05/31/20  Deno Etienne, DO  lisinopril (ZESTRIL) 30 MG tablet Take 1 tablet (30 mg total) by mouth daily. 06/02/19   Lawyer, Harrell Gave, PA-C  methocarbamol (ROBAXIN) 500 MG tablet Take 1 tablet (500 mg total) by mouth 2 (two) times daily as needed for muscle spasms.  04/07/20   Jaynee Eagles, PA-C  naproxen (NAPROSYN) 375 MG tablet Take 1 tablet (375 mg total) by mouth 2 (two) times daily with a meal. 05/10/20   Jaynee Eagles, PA-C  Nutritional Supplements (ENSURE NUTRITION SHAKE) LIQD Take 1 Can by mouth daily. 10/07/13   Bernadene Bell, MD  traMADol (ULTRAM) 50 MG tablet Take 1 tablet (50 mg total) by mouth every 6 (six) hours as needed. 04/07/20   Jaynee Eagles, PA-C      Allergies    Patient has no known allergies.    Review of Systems   Review of Systems  Eyes:  Negative for visual disturbance.  Respiratory:  Negative for shortness of breath.   Cardiovascular:  Negative for chest pain.  Neurological:  Negative for headaches.  Psychiatric/Behavioral:  The patient is nervous/anxious.     Physical Exam Updated Vital Signs BP (!) 200/115 (BP Location: Left Arm)   Pulse 88   Temp 97.7 F (36.5 C) (Oral)   Resp 19   Ht '5\' 4"'$  (1.626 m)   Wt 52.2 kg   SpO2 100%   BMI 19.74 kg/m  Physical Exam Vitals and nursing note reviewed.  Constitutional:      General: She is not in acute distress.    Appearance: Normal appearance. She is not ill-appearing, toxic-appearing or diaphoretic.  HENT:     Head: Normocephalic and atraumatic.     Nose: Nose normal. No congestion.     Mouth/Throat:     Mouth: Mucous membranes are moist.  Pharynx: Oropharynx is clear.  Eyes:     Extraocular Movements: Extraocular movements intact.     Conjunctiva/sclera: Conjunctivae normal.     Pupils: Pupils are equal, round, and reactive to light.  Cardiovascular:     Rate and Rhythm: Normal rate and regular rhythm.  Pulmonary:     Effort: Pulmonary effort is normal.     Breath sounds: Normal breath sounds. No wheezing.  Abdominal:     General: Abdomen is flat. Bowel sounds are normal.     Palpations: Abdomen is soft.     Tenderness: There is no abdominal tenderness.  Musculoskeletal:     Cervical back: Normal range of motion and neck supple. No tenderness.     Right  lower leg: No edema.     Left lower leg: No edema.  Skin:    General: Skin is warm and dry.     Capillary Refill: Capillary refill takes less than 2 seconds.  Neurological:     General: No focal deficit present.     Mental Status: She is alert and oriented to person, place, and time.     GCS: GCS eye subscore is 4. GCS verbal subscore is 5. GCS motor subscore is 6.     Cranial Nerves: Cranial nerves 2-12 are intact. No cranial nerve deficit.     Sensory: Sensation is intact. No sensory deficit.     Motor: Motor function is intact. No weakness.     Coordination: Coordination is intact. Heel to [facility] Test normal.     Comments: Cranial nerves II through XII intact.  No pronator drift, no slurred speech, no facial droop.  5-5 strength bilateral upper and lower extremities.  Intact finger-nose, heel-to-shin.     ED Results / Procedures / Treatments   Labs (all labs ordered are listed, but only abnormal results are displayed) Labs Reviewed  CBC WITH DIFFERENTIAL/PLATELET - Abnormal; Notable for the following components:      Result Value   Hemoglobin 11.5 (*)    MCH 25.7 (*)    Platelets 132 (*)    All other components within normal limits  BASIC METABOLIC PANEL - Abnormal; Notable for the following components:   Potassium 3.4 (*)    Glucose, Bld 105 (*)    All other components within normal limits  TSH  TROPONIN I (HIGH SENSITIVITY)  TROPONIN I (HIGH SENSITIVITY)    EKG EKG Interpretation  Date/Time:  Sunday May 15 2022 00:01:06 EST Ventricular Rate:  84 PR Interval:  158 QRS Duration: 92 QT Interval:  368 QTC Calculation: 434 R Axis:   -7 Text Interpretation: Normal sinus rhythm Incomplete right bundle branch block Cannot rule out Anterior infarct , age undetermined Abnormal ECG When compared with ECG of 01-Jun-2019 19:51, No significant change since last tracing Confirmed by Aletta Edouard [phone]) on 05/15/2022 7:18:04 AM  Radiology DG Chest 2 View  Result Date:  05/15/2022 CLINICAL DATA:  Hypertension and increased anxiety EXAM: CHEST - 2 VIEW COMPARISON:  04/07/2020 FINDINGS: Cardiac shadow is stable. Aortic calcifications are noted. The lungs are hyperinflated bilaterally. No focal infiltrate or effusion is seen. No bony abnormality is noted. IMPRESSION: Hyperinflation without acute abnormality. Electronically Signed   By: Inez Catalina M.D.   On: 05/15/2022 01:16    Procedures Procedures    Medications Ordered in ED Medications  lisinopril (ZESTRIL) tablet 30 mg (30 mg Oral Given 05/15/22 0836)  cloNIDine (CATAPRES) tablet 0.1 mg (0.1 mg Oral Given 05/15/22 1050)    ED Course/  Medical Decision Making/ A&P }                          Medical Decision Making Amount and/or Complexity of Data Reviewed Labs: ordered.  Risk Prescription drug management.   72 year old female presents to the ED for evaluation.  Please see HPI for further details.  On examination the patient is afebrile and nontachycardic.  The patient lung sounds are clear bilaterally, she is not hypoxic.  Patient abdomen soft and compressible throughout.  Neurological examination shows no focal neurodeficits.  The patient is nontoxic in appearance.  Patient workup initiated in triage include CBC, BMP, troponin, chest x-ray, EKG.  I have added on TSH at the patient's request.  Patient troponin 4 and 5 effectively.  The patient CBC is unremarkable with a stable hemoglobin no leukocytosis.  The patient BMP is unremarkable.  Patient TSH not elevated.  Patient chest x-ray clear, no consolidations or effusions.  Patient EKG nonischemic.  Patient initially presented with complaints of anxiety however she was shown to be hypertensive and stated that she had not taken her hypertensive medication.  Patient was provided home hypertensive medication, lisinopril 30 mg.  Patient initially showed improvement with blood pressure however her blood pressure did elevate again after administration of  lisinopril.  At this time, 0.1 mg clonidine was administered.  Patient blood pressure continued to elevate at this time.  Patient requested something for anxiety as she states this is the primary reason for her visit.  The patient states that she is driving home and so this limits what I can give her in terms of antianxiety medication.  I requested that the patient find a ride home however she stated that she did not have anyone that can come get her.  Patient continued requesting antianxiety medication at this time, requested that I give it to her for her to take at home.  I advised the patient that this is not a realistic solution to the issue.  I advised the patient that I would like to continue treating her blood pressure however she was requesting discharge at this time.  The patient reports that she "felt better" and had gotten up and moved around and this helped her feel much better.  The patient states that currently her blood pressure is 162/104 on the monitor, she points to the monitor and says "see, it looks better".  The patient is requesting discharge at this time.  Patient denies any chest pain, shortness of breath, headache, blurred vision.  The patient is alert and oriented x 4.  The patient denies any of these symptoms, states that she has never experienced these while here.  Patient reports she believes her blood pressure is high due to missing her blood pressure medication this morning.  I had a very long conversation with the patient about her need to control her blood pressure and my desire to administer more medications to help achieve this.  The patient continued to request discharge stating that she felt better.  I advised the patient that if she begins to develop chest pain, shortness of breath, blurred vision, headache she should return to the ER immediately for further management.  I again, for the third time, advised the patient that I would like to continue with her workup and management  to control her blood pressure however she continued to request discharge stating that her blood pressure was 162/104 on the monitor. I advised the patient that  this value is not ideal and would like to continue with workup however she again requested discharge.  I advised the patient that she should monitor her blood pressure throughout the course of the day and week and if it continues to elevate she will need to come back to the ED for further management.  The patient voiced understanding of my instructions.  The patient voiced agreement with plan of management. I discussed this patient and her case with my attending Dr. Melina Copa and he voiced agreement with plan of management. Patient discharged at this time.   Final Clinical Impression(s) / ED Diagnoses Final diagnoses:  Anxiety  Hypertension, unspecified type    Rx / DC Orders ED Discharge Orders     None          Lawana Chambers 05/15/22 1230    Hayden Rasmussen, MD 05/15/22 1731

## 2022-05-15 NOTE — ED Notes (Signed)
Patient ambulatory to the bathroom at this time.

## 2022-05-15 NOTE — Consult Note (Signed)
[facility] Consult   Reason for Consult: "Patient here with anxiety, feelings of hopelessness, many life stressors at home" Referring Physician:  Azucena Cecil, PA-C   Patient Identification: Mindy Mercer MRN:  [id number] Principal Diagnosis: Anxiety Diagnosis:  Principal Problem:   Anxiety  Total Time spent with patient: 30 minutes  Subjective:   Mindy Mercer is a 72 y.o. female patient admitted with complain of anxiety.  HPI:  Mindy Mercer is a 72 y.o. female with psychiatric history of anxiety and depression, she came in to [facility] with a complaint of having anxiety.  Patient was seen face to face by this provider and chart reviewed.   Per chart review, patient presented to the ED due to having some dehydration, high blood pressure and having anxiety. She has not been taking blood pressure medication for the past 3 years.   On evaluation patient is alert and oriented x4, speech is clear and coherent. Patient's eye contact is good, mood is euthymic, affect is appropriate. Patient's thought process is goal directed and thought content is within normal limits. Patient denies SI HI, denies AVH, or paranoia. There is no indication that the patient is responding to internal stimuli and no delusion noted.    During evaluation, patient reported having anxiety lately, she said she was not sure if it was because of her blood pressure.  She said that this episode started a week ago; patient says she has not been taking her blood pressure medicine for the past 3 years. She reported that she had a fall last week after she got out of bed too quickly, she said she felt a little dizzy afterwards. Patient denies seeing a therapist or psychiatrist. Patient denies using drugs or alcohol. Patient described her sleep as good and appetite as fair.   Support, encouragement and reassurance provided about ongoing stressors and patient provided with opportunity for questions.     Patient  does not meet inpatient psychiatry admission criteria and there is no evidence of imminent danger to self or others.      Past Psychiatric History: Anxiety, Depression  Risk to Self: No Risk to Others: No Prior Inpatient Therapy: No Prior Outpatient Therapy: No  Past Medical History:  Past Medical History:  Diagnosis Date   Anemia 1970   Blood transfusion 1970   Cataracts, bilateral 2006   Fibroid tumor 1987   Hypertension 2011   Migraines 1986   On disability for migraines. Has been treated with inderal in the past.    No past surgical history on file. Family History:  Family History  Problem Relation Age of Onset   Hypertension Mother    Ulcers Father    Dementia Sister    Migraines Sister    Fibroids Sister    Family Psychiatric  History: None Social History:  Social History   Substance and Sexual Activity  Alcohol Use No     Social History   Substance and Sexual Activity  Drug Use No    Social History   Socioeconomic History   Marital status: Legally Separated    Spouse name: Not on file   Number of children: 4   Years of education: 14   Highest education level: Not on file  Occupational History   Not on file  Tobacco Use   Smoking status: Never   Smokeless tobacco: Never  Substance and Sexual Activity   Alcohol use: No   Drug use: No   Sexual activity: Yes    Birth  control/protection: None  Other Topics Concern   Not on file  Social History Narrative   Lives with partner of 12 years and son.          Social Determinants of Health   Financial Resource Strain: Not on file  Food Insecurity: Not on file  Transportation Needs: Not on file  Physical Activity: Not on file  Stress: Not on file  Social Connections: Not on file   Additional Social History: None  Allergies:  No Known Allergies  Labs:  Results for orders placed or performed during the hospital encounter of 05/14/22 (from the past 48 hour(s))  CBC with Differential     Status:  Abnormal   Collection Time: 05/15/22 12:21 AM  Result Value Ref Range   WBC 7.2 4.0 - 10.5 K/uL   RBC 4.48 3.87 - 5.11 MIL/uL   Hemoglobin 11.5 (L) 12.0 - 15.0 g/dL   HCT 37.9 36.0 - 46.0 %   MCV 84.6 80.0 - 100.0 fL   MCH 25.7 (L) 26.0 - 34.0 pg   MCHC 30.3 30.0 - 36.0 g/dL   RDW 13.5 11.5 - 15.5 %   Platelets 132 (L) 150 - 400 K/uL    Comment: REPEATED TO VERIFY   nRBC 0.0 0.0 - 0.2 %   Neutrophils Relative % 52 %   Neutro Abs 3.8 1.7 - 7.7 K/uL   Lymphocytes Relative 39 %   Lymphs Abs 2.8 0.7 - 4.0 K/uL   Monocytes Relative 6 %   Monocytes Absolute 0.4 0.1 - 1.0 K/uL   Eosinophils Relative 2 %   Eosinophils Absolute 0.2 0.0 - 0.5 K/uL   Basophils Relative 1 %   Basophils Absolute 0.0 0.0 - 0.1 K/uL   Immature Granulocytes 0 %   Abs Immature Granulocytes 0.01 0.00 - 0.07 K/uL    Comment: Performed at [facility], [address], [place], [place] [address]  Basic metabolic panel     Status: Abnormal   Collection Time: 05/15/22 12:21 AM  Result Value Ref Range   Sodium 139 135 - 145 mmol/L   Potassium 3.4 (L) 3.5 - 5.1 mmol/L   Chloride 103 98 - 111 mmol/L   CO2 27 22 - 32 mmol/L   Glucose, Bld 105 (H) 70 - 99 mg/dL    Comment: Glucose reference range applies only to samples taken after fasting for at least 8 hours.   BUN 14 8 - 23 mg/dL   Creatinine, Ser 0.65 0.44 - 1.00 mg/dL   Calcium 9.2 8.9 - 10.3 mg/dL   GFR, Estimated >60 >60 mL/min    Comment: (NOTE) Calculated using the CKD-EPI Creatinine Equation (2021)    Anion gap 9 5 - 15    Comment: Performed at [facility] [address], [place], [place] [address]  Troponin I (High Sensitivity)     Status: None   Collection Time: 05/15/22 12:21 AM  Result Value Ref Range   Troponin I (High Sensitivity) 4 <18 ng/L    Comment: (NOTE) Elevated high sensitivity troponin I (hsTnI) values and significant  changes across serial measurements may suggest ACS but many other  chronic and acute conditions are  known to elevate hsTnI results.  Refer to the "Links" section for chest pain algorithms and additional  guidance. Performed at [facility], [place] [address] [place], [place] [address]   Troponin I (High Sensitivity)     Status: None   Collection Time: 05/15/22  2:07 AM  Result Value Ref Range   Troponin I (High Sensitivity) 5 <18 ng/L    Comment: (NOTE) Elevated high sensitivity troponin I (hsTnI) values and significant  changes across serial measurements may suggest ACS but many other  chronic and acute conditions are known to elevate hsTnI results.  Refer to the "Links" section for chest pain algorithms and additional  guidance. Performed at [facility], [place] [address], [place], [place] [address]   TSH     Status: None   Collection Time: 05/15/22  9:00 AM  Result Value Ref Range   TSH 0.419 0.350 - 4.500 uIU/mL    Comment: Performed by a 3rd Generation assay with a functional sensitivity of <=0.01 uIU/mL. Performed at [facility], [place] [address], [place], [place] [address]     No current facility-administered medications for this encounter.   Current Outpatient Medications  Medication Sig Dispense Refill   Aspirin-Acetaminophen-Caffeine (EXCEDRIN PO) Take 1 tablet by mouth daily as needed (pain).     lisinopril (ZESTRIL) 30 MG tablet Take 1 tablet (30 mg total) by mouth daily. 30 tablet 11   lisinopril (ZESTRIL) 30 MG tablet Take 1 tablet (30 mg total) by mouth daily. 30 tablet 0   methocarbamol (ROBAXIN) 500 MG tablet Take 1 tablet (500 mg total) by mouth 2 (two) times daily as needed for muscle spasms. 30 tablet 0   naproxen (NAPROSYN) 375 MG tablet Take 1 tablet (375 mg total) by mouth 2 (two) times daily with a meal. 30 tablet 0   Nutritional Supplements (ENSURE NUTRITION SHAKE) LIQD Take 1 Can by mouth daily. 30 Bottle 3   traMADol (ULTRAM) 50 MG tablet Take 1 tablet (50 mg total) by mouth every 6 (six) hours as needed. 15 tablet 0     Musculoskeletal: Strength & Muscle Tone: within normal limits Gait & Station: normal Patient leans: N/A    Psychiatric Specialty Exam:  Presentation  General Appearance:  Appropriate for Environment  Eye Contact: Fair  Speech: Normal Rate  Speech Volume: Normal  Handedness:No data recorded  Mood and Affect  Mood: Euthymic  Affect: Appropriate   Thought Process  Thought Processes: Goal Directed  Descriptions of Associations:Intact  Orientation:Full (Time, Place and Person)  Thought Content:WDL  History of Schizophrenia/Schizoaffective disorder:No data recorded Duration of Psychotic Symptoms:No data recorded Hallucinations:Hallucinations: None  Ideas of Reference:None  Suicidal Thoughts:Suicidal Thoughts: No  Homicidal Thoughts:Homicidal Thoughts: No   Sensorium  Memory: Immediate Good; Recent Good; Remote Good  Judgment: Good  Insight: Good   Executive Functions  Concentration: Good  Attention Span: Good  Recall: Good  Fund of Knowledge: Good  Language: Good   Psychomotor Activity  Psychomotor Activity: Psychomotor Activity: Normal   Assets  Assets: Communication Skills; Social Support; Resilience; Desire for Improvement; Physical Health   Sleep  Sleep: Sleep: Fair   Physical Exam: Physical Exam Vitals reviewed.  Constitutional:      Appearance: Normal appearance.  Eyes:     General:        Right eye: No discharge.        Left eye: No discharge.  Cardiovascular:     Rate and Rhythm: Normal rate.  Pulmonary:     Effort: No respiratory distress.     Breath sounds: No wheezing.  Neurological:     Mental Status: She is alert and oriented to person, place, and time.     Motor: No weakness.  Psychiatric:        Attention and Perception: Attention  normal.        Mood and Affect: Mood is anxious.        Speech: Speech normal.        Behavior: Behavior normal.        Thought Content: Thought content is  not paranoid or delusional. Thought content does not include suicidal ideation.        Judgment: Judgment normal.    Review of Systems  Constitutional:  Negative for fever.  HENT:  Negative for ear discharge and hearing loss.   Respiratory:  Negative for cough, shortness of breath and wheezing.   Cardiovascular:  Negative for chest pain and palpitations.  Gastrointestinal:  Negative for abdominal pain, nausea and vomiting.  Neurological:  Negative for dizziness, seizures, weakness and headaches.  Psychiatric/Behavioral:  Negative for hallucinations, substance abuse and suicidal ideas.    Blood pressure (!) 144/113, pulse 66, temperature 98.3 F (36.8 C), temperature source Oral, resp. rate 16, height '5\' 4"'$  (1.626 m), weight 52.2 kg, SpO2 100 %. Body mass index is 19.74 kg/m.  Treatment Plan Summary: Plan : Patient will be given outpatient resources for psychiatric treatment, and medication management. Patient will be discharged home.  Disposition: No evidence of imminent risk to self or others at present.   Patient does not meet criteria for psychiatric inpatient admission. Supportive therapy provided about ongoing stressors. Discussed crisis plan, support from social network, calling 911, coming to the Emergency Department, and calling Suicide Hotline.  Patient will be given outpatient resources for psychiatric treatment, therapy and medication management.   Discussed methods to reduce the risk of self-injury or suicide attempts: Frequent conversations regarding unsafe thoughts. Remove all significant sharps. Remove all firearms. Remove all medications, including over-the-counter meds. Consider lockbox for medications and having a responsible person dispense medications until patient has strengthened coping skills. Room checks for sharps or other harmful objects. Secure all chemical substances that can be ingested or inhaled.   Please refrain from using alcohol or illicit substances,  as they can affect your mood and can cause depression, anxiety or other concerning symptoms.  Alcohol can increase the chance that a person will make reckless decisions, like attempting suicide, and can increase the lethality of a drug overdose.      Discussed crisis plan, calling 911, or going to the ED if condition changes or worsens.  Patient verbalized her understanding.     Earney Mallet, NP 05/15/2022 11:18 AM

## 2022-05-15 NOTE — Discharge Instructions (Addendum)
Please return to the ED with any new symptoms such as chest pain, shortness of breath, headache, blurred vision Please continue monitoring your blood pressure today and throughout the course of the week.  If your blood pressure continues to elevate, as we discussed, you will need to return to the ED for further management.  You have requested discharge today prior to workup being complete.  If at any point you develop the above symptoms please return to the ED immediately. Please review the resources provided below for mental health access options. Please begin recording her blood pressure at home with the form provided in this chart Please read attached guides concerning hypertension and managing anxiety as an adult  Outpatient Substance Use Treatment Services   [facility] [address] [place], [place] [address]  [phone] Private insurance, Medicare A&B, and [facility]   ADS (Alcohol and Drug Services)  [address]  [place], [place] [address] [phone] Medicaid, [place] [address] [place]  [place], [place] Medicaid, [facility], Self Pay   [facility] [address]  , [place] Self Pay Outpatient substance use treatment for individuals ages 13-25 Offer scholarships from the [facility] to help pay for treatment   Fellowship [address]      [address]    [place], [place] [address]  [phone] or [phone] Private Insurance Only                 [facility] Total [facility] Care [address]  [place], [place] [phone] Medicaid, Medicare, [place] at the [facility] [address] [place], [place] [address] [phone] Services are free or  reduced  [facility] [place], [place] [address] [phone] Medicaid, Self-Pay/Uninsured  [place]  [address]  [place], [place] [address] [phone] ([facility]) Self Pay/uninsured, Medicaid Only   [facility] [place], [place] [address] [phone] Medicaid, Medicare, [place] Location [address] [place], [place] [address] [place] Only             Adolescent Substance Use Treatment Services   [facility] [address]  , [place] Self Pay Offer scholarships from the [facility] to help pay for treatment  Website: SwapReview.es  [facility] Adolescent Substance use Program Males ages: 24-17 Adolescent Substance use Program Females: 12-17  [facility] [address]  [place], [place] [address] (ph) [phone]  (fax) [phone]  [facility]  [address] [place] [address]  [place], [place] [address] (ph) [phone]  (fax) [phone]  [address] [place], [place] [address] (ph) [phone]  (fax) [phone]  [facility] [address], [place], [place], [place] [address] (ph) [phone]   (fax) [phone]  Website: [email or web address].com/            Residential Substance Use Treatment Services   [facility]  [place],  [address]  [phone] or [phone] Detox (Medicare, Medicaid, private insurance, and self pay/uninsured)  Residential Rehab 14 days (Medicare, [place], private insurance, and self pay/uninsured)   RTS (Residential Treatment  Services)  [place], [place]  Female and Female Detox (Self Pay/Uninsured and Medicaid limited availability)  Rehab  only Female (Medicaid and self pay/uninsured only)   Fellowship [address]      [address]  [place], [place] [address]  [phone] or [phone] Detox and [place]  [place].  [place], [place] [address]  [phone]  Treatment Only, must make assessment appointment, and must be sober for assessment appointment.  Self Pay/uninsured Only, Medicare A&B, [facility], [place] ID only!  [place], [place] [address] Walk in interviews M-Sat 8-4p No pending legal charges No insurance requirement [phone]     ADATC:  [facility] Referral  [address] [place], [place] (Self Pay/uninsured, [facility])  [facility] [address] [place], [place] [address] [phone] Detox and Residential Treatment Medicare and [place].  [place], [place] [address] [place]: [place]: [phone] Long-term Residential Program:  [phone] Males 25 and Over (No Insurance, upfront fee)  [place], [place] [address] [phone] Private Insurance with [place], [place], [place] [address] Local ([place].  [place], [place] [address]  [phone] (Males, upfront fee)  [place]  [place], [place] Locations  [facility]  [address]  [place], [place] Based Program for individuals experiencing homelessness Self Pay, No insurance  [place]  Men's program: [facility] [address] [place], [place] [address] [phone]  [place] Women's program: [facility] [address] [place], [place] [address] [phone] Christian Based Program for individuals experiencing homelessness Self Pay, No insurance  [facility] Men's Division [place], [place] [address]  [place] for individuals experiencing homelessness Self Pay, No insurance  [facility] Women's Division [place], [place] [address] [place] for individuals experiencing homelessness Self Pay, No insurance

## 2022-05-15 NOTE — ED Notes (Signed)
Pt in bed, pt states that she is having an increase in anxiety and panic attacks, states that she will go for a walk and this helps, states that her blood pressure is also high, pt denies pain, resps even and unlabored, provided calming environment.  Pt awaits md eval.

## 2022-07-13 ENCOUNTER — Ambulatory Visit (HOSPITAL_COMMUNITY): Payer: 59 | Admitting: Psychiatry

## 2022-08-29 ENCOUNTER — Ambulatory Visit (HOSPITAL_COMMUNITY): Payer: 59 | Admitting: Psychiatry

## 2023-04-04 ENCOUNTER — Other Ambulatory Visit (HOSPITAL_COMMUNITY): Payer: Self-pay | Admitting: Endocrinology

## 2023-04-04 DIAGNOSIS — E0590 Thyrotoxicosis, unspecified without thyrotoxic crisis or storm: Secondary | ICD-10-CM

## 2023-04-24 ENCOUNTER — Ambulatory Visit (HOSPITAL_COMMUNITY)
Admission: RE | Admit: 2023-04-24 | Discharge: 2023-04-24 | Disposition: A | Discharge status: Home / Self Care | Payer: 59 | Source: Ambulatory Visit | Attending: Endocrinology | Admitting: Endocrinology

## 2023-04-24 ENCOUNTER — Encounter (HOSPITAL_COMMUNITY)
Admission: RE | Admit: 2023-04-24 | Discharge: 2023-04-24 | Disposition: A | Discharge status: Home / Self Care | Payer: 59 | Source: Ambulatory Visit | Attending: Endocrinology | Admitting: Endocrinology

## 2023-04-24 DIAGNOSIS — E0590 Thyrotoxicosis, unspecified without thyrotoxic crisis or storm: Secondary | ICD-10-CM | POA: Diagnosis present

## 2023-04-24 MED ORDER — SODIUM IODIDE I-123 7.4 MBQ CAPS: 436.0000 | ORAL_CAPSULE | Freq: Once | ORAL | Status: DC

## 2023-04-25 ENCOUNTER — Encounter (HOSPITAL_COMMUNITY)
Admission: RE | Admit: 2023-04-25 | Discharge: 2023-04-25 | Disposition: A | Discharge status: Home / Self Care | Payer: 59 | Source: Ambulatory Visit | Attending: Endocrinology | Admitting: Endocrinology
